# Patient Record
Sex: Male | Born: 1955 | Race: White | Hispanic: No | Marital: Married | Smoking: Former smoker
Health system: Southern US, Community
[De-identification: ages and names within clinical notes are randomized; demographics above are authoritative.]

## PROBLEM LIST (undated history)

## (undated) DIAGNOSIS — Z789 Other specified health status: Secondary | ICD-10-CM

---

## 2000-01-26 ENCOUNTER — Emergency Department (HOSPITAL_COMMUNITY): Admission: EM | Admit: 2000-01-26 | Discharge: 2000-01-26 | Payer: Self-pay | Admitting: Emergency Medicine

## 2021-08-18 ENCOUNTER — Encounter (HOSPITAL_BASED_OUTPATIENT_CLINIC_OR_DEPARTMENT_OTHER): Payer: Self-pay

## 2021-08-18 ENCOUNTER — Other Ambulatory Visit: Payer: Self-pay

## 2021-08-18 DIAGNOSIS — D72829 Elevated white blood cell count, unspecified: Secondary | ICD-10-CM | POA: Insufficient documentation

## 2021-08-18 DIAGNOSIS — R112 Nausea with vomiting, unspecified: Secondary | ICD-10-CM | POA: Insufficient documentation

## 2021-08-18 DIAGNOSIS — R9431 Abnormal electrocardiogram [ECG] [EKG]: Secondary | ICD-10-CM | POA: Diagnosis not present

## 2021-08-18 DIAGNOSIS — R1084 Generalized abdominal pain: Secondary | ICD-10-CM | POA: Diagnosis not present

## 2021-08-18 LAB — CBC
HCT: 47.3 % (ref 39.0–52.0)
Hemoglobin: 16.2 g/dL (ref 13.0–17.0)
MCH: 29.8 pg (ref 26.0–34.0)
MCHC: 34.2 g/dL (ref 30.0–36.0)
MCV: 87.1 fL (ref 80.0–100.0)
Platelets: 284 10*3/uL (ref 150–400)
RBC: 5.43 MIL/uL (ref 4.22–5.81)
RDW: 12.9 % (ref 11.5–15.5)
WBC: 13.8 10*3/uL — ABNORMAL HIGH (ref 4.0–10.5)
nRBC: 0 % (ref 0.0–0.2)

## 2021-08-18 NOTE — ED Triage Notes (Signed)
Pt arrives with c/o ABD pain that started a few hours ago. Pt endorses n/v.  ?

## 2021-08-18 NOTE — ED Notes (Signed)
Pt given urine cup for urine specimen. Pt stated he is unable to go at this time.  ?

## 2021-08-19 ENCOUNTER — Emergency Department (HOSPITAL_BASED_OUTPATIENT_CLINIC_OR_DEPARTMENT_OTHER)
Admission: EM | Admit: 2021-08-19 | Discharge: 2021-08-19 | Disposition: A | Discharge status: Home / Self Care | Payer: BC Managed Care – PPO | Attending: Emergency Medicine | Admitting: Emergency Medicine

## 2021-08-19 ENCOUNTER — Emergency Department (HOSPITAL_BASED_OUTPATIENT_CLINIC_OR_DEPARTMENT_OTHER): Payer: BC Managed Care – PPO

## 2021-08-19 DIAGNOSIS — K5730 Diverticulosis of large intestine without perforation or abscess without bleeding: Secondary | ICD-10-CM | POA: Diagnosis not present

## 2021-08-19 DIAGNOSIS — K8020 Calculus of gallbladder without cholecystitis without obstruction: Secondary | ICD-10-CM | POA: Diagnosis not present

## 2021-08-19 DIAGNOSIS — R1084 Generalized abdominal pain: Secondary | ICD-10-CM

## 2021-08-19 DIAGNOSIS — N281 Cyst of kidney, acquired: Secondary | ICD-10-CM | POA: Diagnosis not present

## 2021-08-19 LAB — URINALYSIS, ROUTINE W REFLEX MICROSCOPIC
Bilirubin Urine: NEGATIVE
Glucose, UA: NEGATIVE mg/dL
Hgb urine dipstick: NEGATIVE
Ketones, ur: 40 mg/dL — AB
Leukocytes,Ua: NEGATIVE
Nitrite: NEGATIVE
Specific Gravity, Urine: 1.021 (ref 1.005–1.030)
pH: 8 (ref 5.0–8.0)

## 2021-08-19 LAB — COMPREHENSIVE METABOLIC PANEL
ALT: 33 U/L (ref 0–44)
AST: 23 U/L (ref 15–41)
Albumin: 5.1 g/dL — ABNORMAL HIGH (ref 3.5–5.0)
Alkaline Phosphatase: 77 U/L (ref 38–126)
Anion gap: 17 — ABNORMAL HIGH (ref 5–15)
BUN: 17 mg/dL (ref 8–23)
CO2: 22 mmol/L (ref 22–32)
Calcium: 10.7 mg/dL — ABNORMAL HIGH (ref 8.9–10.3)
Chloride: 99 mmol/L (ref 98–111)
Creatinine, Ser: 1.19 mg/dL (ref 0.61–1.24)
GFR, Estimated: >60 mL/min (ref >60)
Glucose, Bld: 182 mg/dL — ABNORMAL HIGH (ref 70–99)
Potassium: 3.8 mmol/L (ref 3.5–5.1)
Sodium: 138 mmol/L (ref 135–145)
Total Bilirubin: 0.5 mg/dL (ref 0.3–1.2)
Total Protein: 7.9 g/dL (ref 6.5–8.1)

## 2021-08-19 LAB — LIPASE, BLOOD: Lipase: 20 U/L (ref 11–51)

## 2021-08-19 MED ORDER — SODIUM CHLORIDE 0.9 % IV BOLUS
1000.0000 mL | Freq: Once | INTRAVENOUS | Status: AC
  Administered 2021-08-19: 1000 mL via INTRAVENOUS

## 2021-08-19 MED ORDER — ONDANSETRON 4 MG PO TBDP: 4.0000 mg | ORAL_TABLET | Freq: Three times a day (TID) | ORAL | 0 refills | Status: DC | PRN

## 2021-08-19 MED ORDER — MORPHINE SULFATE (PF) 4 MG/ML IV SOLN
4.0000 mg | Freq: Once | INTRAVENOUS | Status: AC
Start: 2021-08-19 — End: 2021-08-19
  Administered 2021-08-19: 4 mg via INTRAVENOUS
  Filled 2021-08-19: qty 1

## 2021-08-19 MED ORDER — OXYCODONE-ACETAMINOPHEN 5-325 MG PO TABS: 1.0000 | ORAL_TABLET | Freq: Four times a day (QID) | ORAL | 0 refills | Status: DC | PRN

## 2021-08-19 MED ORDER — IOHEXOL 300 MG/ML  SOLN
100.0000 mL | Freq: Once | INTRAMUSCULAR | Status: AC | PRN
  Administered 2021-08-19: 100 mL via INTRAVENOUS

## 2021-08-19 MED ORDER — ONDANSETRON HCL 4 MG/2ML IJ SOLN
4.0000 mg | Freq: Once | INTRAMUSCULAR | Status: AC
  Administered 2021-08-19: 4 mg via INTRAVENOUS
  Filled 2021-08-19: qty 2

## 2021-08-19 NOTE — Discharge Instructions (Signed)
You were seen today for abdominal pain.  Your CT scan shows a dilated loop of bowel which could transient or an ileus.  Make sure that you are staying hydrated.  Take medications as prescribed.  Stick to easily digestible foods such as broths/soups for the next 24 hours. ?

## 2021-08-19 NOTE — ED Provider Notes (Signed)
?[facility] EMERGENCY DEPT ?Provider Note ? ? ?CSN: [id number] ?Arrival date & time: 08/18/21  2257 ? ?  ? ?History ? ?Chief Complaint  ?Patient presents with  ? Abdominal Pain  ? ? ?Jeffrey Green is a 66 y.o. male. ? ?HPI ? ?  ? ?This is a 66 year old male with no reported past medical history who presents with abdominal pain and vomiting.  Patient reports acute onset of abdominal pain earlier yesterday evening.  Reports associated nonbilious, nonbloody emesis.  Initially started across the mid abdomen and then radiated upwards.  Patient had a similar episode a week or 2 ago that self resolved.  He reports normal bowel movements.  No prior abdominal surgical history.  Denies alcohol or drug use.  States that the pain is sharp and can come and go.  Denies urinary symptoms or fevers. ? ?Home Medications ?Prior to Admission medications   ?Medication Sig Start Date End Date Taking? Authorizing Provider  ?ondansetron (ZOFRAN-ODT) 4 MG disintegrating tablet Take 1 tablet (4 mg total) by mouth every 8 (eight) hours as needed for nausea or vomiting. 08/19/21  Yes Aaleyah Witherow, Barbette Hair, MD  ?oxyCODONE-acetaminophen (PERCOCET/ROXICET) 5-325 MG tablet Take 1 tablet by mouth every 6 (six) hours as needed for severe pain. 08/19/21   Antrice Pal, Barbette Hair, MD  ?   ? ?Allergies    ?Patient has no known allergies.   ? ?Review of Systems   ?Review of Systems  ?Constitutional:  Negative for fever.  ?Respiratory:  Negative for shortness of breath.   ?Cardiovascular:  Negative for chest pain.  ?Gastrointestinal:  Positive for abdominal pain, nausea and vomiting. Negative for constipation and diarrhea.  ?All other systems reviewed and are negative. ? ?Physical Exam ?Updated Vital Signs ?BP (!) 165/87   Pulse 94   Temp 98.9 ?F (37.2 ?C) (Oral)   Resp 15   Ht 1.803 m (5\' 11" )   Wt 77.1 kg   SpO2 99%   BMI 23.71 kg/m?  ?Physical Exam ?Vitals and nursing note reviewed.  ?Constitutional:   ?   Appearance: He is well-developed.   ?HENT:  ?   Head: Normocephalic and atraumatic.  ?Eyes:  ?   Pupils: Pupils are equal, round, and reactive to light.  ?Cardiovascular:  ?   Rate and Rhythm: Normal rate and regular rhythm.  ?   Heart sounds: Normal heart sounds. No murmur heard. ?Pulmonary:  ?   Effort: Pulmonary effort is normal. No respiratory distress.  ?   Breath sounds: Normal breath sounds. No wheezing.  ?Abdominal:  ?   General: Bowel sounds are normal.  ?   Palpations: Abdomen is soft.  ?   Tenderness: There is generalized abdominal tenderness and tenderness in the epigastric area and periumbilical area. There is no guarding or rebound.  ?Musculoskeletal:  ?   Cervical back: Neck supple.  ?Lymphadenopathy:  ?   Cervical: No cervical adenopathy.  ?Skin: ?   General: Skin is warm and dry.  ?Neurological:  ?   Mental Status: He is alert and oriented to person, place, and time.  ?Psychiatric:     ?   Mood and Affect: Mood normal.  ? ? ?ED Results / Procedures / Treatments   ?Labs ?(all labs ordered are listed, but only abnormal results are displayed) ?Labs Reviewed  ?COMPREHENSIVE METABOLIC PANEL - Abnormal; Notable for the following components:  ?    Result Value  ? Glucose, Bld 182 (*)   ? Calcium 10.7 (*)   ? Albumin 5.1 (*)   ?  Anion gap 17 (*)   ? All other components within normal limits  ?CBC - Abnormal; Notable for the following components:  ? WBC 13.8 (*)   ? All other components within normal limits  ?URINALYSIS, ROUTINE W REFLEX MICROSCOPIC - Abnormal; Notable for the following components:  ? APPearance HAZY (*)   ? Ketones, ur 40 (*)   ? Protein, ur TRACE (*)   ? All other components within normal limits  ?LIPASE, BLOOD  ? ? ?EKG ?EKG Interpretation ? ?Date/Time:  Monday August 19 2021 00:25:09 EDT ?Ventricular Rate:  75 ?PR Interval:  124 ?QRS Duration: 93 ?QT Interval:  428 ?QTC Calculation: 479 ?R Axis:   57 ?Text Interpretation: Sinus rhythm Borderline repolarization abnormality Borderline prolonged QT interval Confirmed by  Thayer Jew [phone]) on 08/19/2021 5:09:54 AM ? ?Radiology ?CT ABDOMEN PELVIS W CONTRAST ? ?Result Date: 08/19/2021 ?CLINICAL DATA:  Abdominal pain with nausea and vomiting. EXAM: CT ABDOMEN AND PELVIS WITH CONTRAST TECHNIQUE: Multidetector CT imaging of the abdomen and pelvis was performed using the standard protocol following bolus administration of intravenous contrast. RADIATION DOSE REDUCTION: This exam was performed according to the departmental dose-optimization program which includes automated exposure control, adjustment of the mA and/or kV according to patient size and/or use of iterative reconstruction technique. CONTRAST:  14mL OMNIPAQUE IOHEXOL 300 MG/ML  SOLN COMPARISON:  None. FINDINGS: Lower chest: No acute abnormality. Hepatobiliary: No focal liver abnormality is seen. Numerous subcentimeter gallstones are seen within the gallbladder lumen, with a 1.4 cm gallstone noted within the gallbladder neck. There is no evidence of gallbladder wall thickening, pericholecystic inflammation or biliary dilatation. Pancreas: Unremarkable. No pancreatic ductal dilatation or surrounding inflammatory changes. Spleen: Normal in size without focal abnormality. Adrenals/Urinary Tract: Adrenal glands are unremarkable. Kidneys are normal in size, without renal calculi or hydronephrosis. A 2.3 cm x 2.2 cm x 2.4 cm simple cyst is seen within the mid to upper right kidney. No additional follow-up or imaging is recommended. Bladder is unremarkable. Stomach/Bowel: There is a small hiatal hernia. Appendix appears normal. A short segment of prominent small bowel is seen within the anterior aspect of the mid abdomen, along the midline (maximum small bowel diameter of approximately 2.9 cm). A transition zone is seen within the anterior aspect of the mid left abdomen (axial CT images 17 through 22, CT series 2). Noninflamed diverticula are seen within the sigmoid colon. Vascular/Lymphatic: No significant vascular findings are  present. No enlarged abdominal or pelvic lymph nodes. Reproductive: Prostate is unremarkable. Other: No abdominal wall hernia or abnormality. No abdominopelvic ascites. Musculoskeletal: Degenerative changes seen within the lumbar spine. IMPRESSION: 1. Short segment of prominent small bowel within the mid abdomen which may be transient in nature. Correlation with follow-up imaging is recommended, as an early small-bowel obstruction versus ileus cannot be excluded. 2. Cholelithiasis. 3. Small hiatal hernia. 4. Sigmoid diverticulosis. Electronically Signed   By: Virgina Norfolk M.D.   On: 08/19/2021 03:05   ? ?Procedures ?Procedures  ? ? ?Medications Ordered in ED ?Medications  ?morphine (PF) 4 MG/ML injection 4 mg (4 mg Intravenous Given 08/19/21 0140)  ?ondansetron (ZOFRAN) injection 4 mg (4 mg Intravenous Given 08/19/21 0140)  ?sodium chloride 0.9 % bolus 1,000 mL (0 mLs Intravenous Stopped 08/19/21 0306)  ?iohexol (OMNIPAQUE) 300 MG/ML solution 100 mL (100 mLs Intravenous Contrast Given 08/19/21 0151)  ? ? ?ED Course/ Medical Decision Making/ A&P ?  ?                        ?  Medical Decision Making ?Amount and/or Complexity of Data Reviewed ?Labs: ordered. ?Radiology: ordered. ? ?Risk ?Prescription drug management. ? ? ?This patient presents to the ED for concern of abdominal pain, nausea, vomiting, this involves an extensive number of treatment options, and is a complaint that carries with it a high risk of complications and morbidity.  The differential diagnosis includes gastroenteritis, obstructive pathology, cholecystitis, appendicitis, colitis ? ?MDM:   ? ?This is a 66 year old male who presents with abdominal pain, nausea, vomiting.  He is nontoxic and vital signs are notable for initially mild tachycardia.  Reports significant pain.  Has had 1 self-limited episode previously.  He has tenderness in the abdomen no signs peritonitis.  Labs reviewed.  LFTs and lipase normal.  This would argue against gallbladder  or pancreatic pathology.  He does have a slight leukocytosis to 13.  CT scan obtained.  CT scan shows 1 prominent loop of small bowel.  Could be related to ileus or early obstruction.  Could also be transient.  Additio

## 2021-08-19 NOTE — ED Notes (Signed)
Patient transported to CT 

## 2021-08-24 ENCOUNTER — Encounter (HOSPITAL_BASED_OUTPATIENT_CLINIC_OR_DEPARTMENT_OTHER): Payer: Self-pay | Admitting: Emergency Medicine

## 2021-08-24 ENCOUNTER — Inpatient Hospital Stay: Admit: 2021-08-24 | Payer: BC Managed Care – PPO | Admitting: Surgery

## 2021-08-24 ENCOUNTER — Emergency Department (HOSPITAL_COMMUNITY): Payer: BC Managed Care – PPO | Admitting: Anesthesiology

## 2021-08-24 ENCOUNTER — Emergency Department (HOSPITAL_BASED_OUTPATIENT_CLINIC_OR_DEPARTMENT_OTHER): Payer: BC Managed Care – PPO | Admitting: Radiology

## 2021-08-24 ENCOUNTER — Encounter (HOSPITAL_COMMUNITY)
Admission: EM | Disposition: A | Discharge status: Home / Self Care | Payer: Self-pay | Source: Home / Self Care | Attending: Emergency Medicine

## 2021-08-24 ENCOUNTER — Emergency Department (HOSPITAL_BASED_OUTPATIENT_CLINIC_OR_DEPARTMENT_OTHER): Payer: BC Managed Care – PPO

## 2021-08-24 ENCOUNTER — Ambulatory Visit (HOSPITAL_BASED_OUTPATIENT_CLINIC_OR_DEPARTMENT_OTHER)
Admission: EM | Admit: 2021-08-24 | Discharge: 2021-08-24 | Disposition: A | Discharge status: Home / Self Care | Payer: BC Managed Care – PPO | Attending: Emergency Medicine | Admitting: Emergency Medicine

## 2021-08-24 ENCOUNTER — Other Ambulatory Visit: Payer: Self-pay

## 2021-08-24 DIAGNOSIS — K810 Acute cholecystitis: Secondary | ICD-10-CM | POA: Diagnosis not present

## 2021-08-24 DIAGNOSIS — I700 Atherosclerosis of aorta: Secondary | ICD-10-CM | POA: Insufficient documentation

## 2021-08-24 DIAGNOSIS — F17210 Nicotine dependence, cigarettes, uncomplicated: Secondary | ICD-10-CM | POA: Diagnosis not present

## 2021-08-24 DIAGNOSIS — R109 Unspecified abdominal pain: Secondary | ICD-10-CM | POA: Diagnosis not present

## 2021-08-24 DIAGNOSIS — K8012 Calculus of gallbladder with acute and chronic cholecystitis without obstruction: Secondary | ICD-10-CM | POA: Insufficient documentation

## 2021-08-24 DIAGNOSIS — K8066 Calculus of gallbladder and bile duct with acute and chronic cholecystitis without obstruction: Secondary | ICD-10-CM | POA: Diagnosis not present

## 2021-08-24 DIAGNOSIS — K8050 Calculus of bile duct without cholangitis or cholecystitis without obstruction: Secondary | ICD-10-CM | POA: Diagnosis not present

## 2021-08-24 DIAGNOSIS — N281 Cyst of kidney, acquired: Secondary | ICD-10-CM | POA: Diagnosis not present

## 2021-08-24 DIAGNOSIS — K8000 Calculus of gallbladder with acute cholecystitis without obstruction: Secondary | ICD-10-CM | POA: Diagnosis not present

## 2021-08-24 HISTORY — PX: CHOLECYSTECTOMY: SHX55

## 2021-08-24 HISTORY — DX: Other specified health status: Z78.9

## 2021-08-24 LAB — COMPREHENSIVE METABOLIC PANEL
ALT: 20 U/L (ref 0–44)
AST: 16 U/L (ref 15–41)
Albumin: 4.7 g/dL (ref 3.5–5.0)
Alkaline Phosphatase: 65 U/L (ref 38–126)
Anion gap: 13 (ref 5–15)
BUN: 10 mg/dL (ref 8–23)
CO2: 26 mmol/L (ref 22–32)
Calcium: 9.3 mg/dL (ref 8.9–10.3)
Chloride: 99 mmol/L (ref 98–111)
Creatinine, Ser: 0.96 mg/dL (ref 0.61–1.24)
GFR, Estimated: >60 mL/min (ref >60)
Glucose, Bld: 157 mg/dL — ABNORMAL HIGH (ref 70–99)
Potassium: 3.3 mmol/L — ABNORMAL LOW (ref 3.5–5.1)
Sodium: 138 mmol/L (ref 135–145)
Total Bilirubin: 0.6 mg/dL (ref 0.3–1.2)
Total Protein: 7.7 g/dL (ref 6.5–8.1)

## 2021-08-24 LAB — CBC WITH DIFFERENTIAL/PLATELET
Abs Immature Granulocytes: 0.02 10*3/uL (ref 0.00–0.07)
Basophils Absolute: 0 10*3/uL (ref 0.0–0.1)
Basophils Relative: 0 %
Eosinophils Absolute: 0 10*3/uL (ref 0.0–0.5)
Eosinophils Relative: 0 %
HCT: 44.7 % (ref 39.0–52.0)
Hemoglobin: 15.5 g/dL (ref 13.0–17.0)
Immature Granulocytes: 0 %
Lymphocytes Relative: 9 %
Lymphs Abs: 1 10*3/uL (ref 0.7–4.0)
MCH: 30.4 pg (ref 26.0–34.0)
MCHC: 34.7 g/dL (ref 30.0–36.0)
MCV: 87.6 fL (ref 80.0–100.0)
Monocytes Absolute: 0.4 10*3/uL (ref 0.1–1.0)
Monocytes Relative: 3 %
Neutro Abs: 9.7 10*3/uL — ABNORMAL HIGH (ref 1.7–7.7)
Neutrophils Relative %: 88 %
Platelets: 255 10*3/uL (ref 150–400)
RBC: 5.1 MIL/uL (ref 4.22–5.81)
RDW: 12.7 % (ref 11.5–15.5)
WBC: 11.2 10*3/uL — ABNORMAL HIGH (ref 4.0–10.5)
nRBC: 0 % (ref 0.0–0.2)

## 2021-08-24 LAB — LIPASE, BLOOD: Lipase: <10 U/L — ABNORMAL LOW (ref 11–51)

## 2021-08-24 LAB — LACTIC ACID, PLASMA: Lactic Acid, Venous: 2 mmol/L (ref 0.5–1.9)

## 2021-08-24 SURGERY — LAPAROSCOPIC CHOLECYSTECTOMY
Anesthesia: General

## 2021-08-24 MED ORDER — ONDANSETRON HCL 4 MG/2ML IJ SOLN
INTRAMUSCULAR | Status: AC
  Filled 2021-08-24: qty 2

## 2021-08-24 MED ORDER — PROPOFOL 10 MG/ML IV BOLUS
INTRAVENOUS | Status: DC | PRN
  Administered 2021-08-24: 170 mg via INTRAVENOUS

## 2021-08-24 MED ORDER — LACTATED RINGERS IV BOLUS
1000.0000 mL | Freq: Once | INTRAVENOUS | Status: AC
Start: 2021-08-24 — End: 2021-08-24
  Administered 2021-08-24: 1000 mL via INTRAVENOUS

## 2021-08-24 MED ORDER — DEXAMETHASONE SODIUM PHOSPHATE 10 MG/ML IJ SOLN
INTRAMUSCULAR | Status: AC
  Filled 2021-08-24: qty 1

## 2021-08-24 MED ORDER — ONDANSETRON HCL 4 MG/2ML IJ SOLN
4.0000 mg | Freq: Once | INTRAMUSCULAR | Status: AC
  Administered 2021-08-24: 4 mg via INTRAVENOUS
  Filled 2021-08-24: qty 2

## 2021-08-24 MED ORDER — ONDANSETRON HCL 4 MG/2ML IJ SOLN
INTRAMUSCULAR | Status: DC | PRN
  Administered 2021-08-24: 4 mg via INTRAVENOUS

## 2021-08-24 MED ORDER — FENTANYL CITRATE (PF) 250 MCG/5ML IJ SOLN
INTRAMUSCULAR | Status: DC | PRN
  Administered 2021-08-24: 25 ug via INTRAVENOUS
  Administered 2021-08-24 (×3): 50 ug via INTRAVENOUS
  Administered 2021-08-24: 25 ug via INTRAVENOUS
  Administered 2021-08-24: 50 ug via INTRAVENOUS

## 2021-08-24 MED ORDER — ONDANSETRON HCL 4 MG/2ML IJ SOLN: 4.0000 mg | Freq: Once | INTRAMUSCULAR | Status: DC | PRN

## 2021-08-24 MED ORDER — SODIUM CHLORIDE 0.9 % IV SOLN
1.0000 g | Freq: Once | INTRAVENOUS | Status: AC
  Administered 2021-08-24: 1 g via INTRAVENOUS
  Filled 2021-08-24: qty 10

## 2021-08-24 MED ORDER — BUPIVACAINE-EPINEPHRINE 0.25% -1:200000 IJ SOLN
INTRAMUSCULAR | Status: DC | PRN
  Administered 2021-08-24: 20 mL

## 2021-08-24 MED ORDER — CHLORHEXIDINE GLUCONATE 0.12 % MT SOLN
OROMUCOSAL | Status: AC
  Administered 2021-08-24: 15 mL via OROMUCOSAL
  Filled 2021-08-24: qty 15

## 2021-08-24 MED ORDER — LACTATED RINGERS IV SOLN: INTRAVENOUS | Status: DC

## 2021-08-24 MED ORDER — OXYCODONE HCL 5 MG PO TABS: 5.0000 mg | ORAL_TABLET | Freq: Once | ORAL | Status: DC | PRN

## 2021-08-24 MED ORDER — CHLORHEXIDINE GLUCONATE 0.12 % MT SOLN: 15.0000 mL | Freq: Once | OROMUCOSAL | Status: AC

## 2021-08-24 MED ORDER — PHENYLEPHRINE HCL-NACL 20-0.9 MG/250ML-% IV SOLN
INTRAVENOUS | Status: DC | PRN
  Administered 2021-08-24: 50 ug/min via INTRAVENOUS

## 2021-08-24 MED ORDER — CELECOXIB 200 MG PO CAPS: 200.0000 mg | ORAL_CAPSULE | Freq: Once | ORAL | Status: AC

## 2021-08-24 MED ORDER — BUPIVACAINE-EPINEPHRINE (PF) 0.25% -1:200000 IJ SOLN
INTRAMUSCULAR | Status: AC
  Filled 2021-08-24: qty 30

## 2021-08-24 MED ORDER — PHENYLEPHRINE 40 MCG/ML (10ML) SYRINGE FOR IV PUSH (FOR BLOOD PRESSURE SUPPORT)
PREFILLED_SYRINGE | INTRAVENOUS | Status: AC
  Filled 2021-08-24: qty 10

## 2021-08-24 MED ORDER — FENTANYL CITRATE (PF) 250 MCG/5ML IJ SOLN
INTRAMUSCULAR | Status: AC
  Filled 2021-08-24: qty 5

## 2021-08-24 MED ORDER — SODIUM CHLORIDE 0.9 % IR SOLN
Status: DC | PRN
  Administered 2021-08-24: 1000 mL

## 2021-08-24 MED ORDER — DEXAMETHASONE SODIUM PHOSPHATE 10 MG/ML IJ SOLN
INTRAMUSCULAR | Status: DC | PRN
  Administered 2021-08-24: 10 mg via INTRAVENOUS

## 2021-08-24 MED ORDER — ACETAMINOPHEN 500 MG PO TABS
ORAL_TABLET | ORAL | Status: AC
  Administered 2021-08-24: 1000 mg via ORAL
  Filled 2021-08-24: qty 2

## 2021-08-24 MED ORDER — LIDOCAINE 2% (20 MG/ML) 5 ML SYRINGE
INTRAMUSCULAR | Status: DC | PRN
  Administered 2021-08-24: 60 mg via INTRAVENOUS

## 2021-08-24 MED ORDER — HYDROMORPHONE HCL 1 MG/ML IJ SOLN
1.0000 mg | Freq: Once | INTRAMUSCULAR | Status: AC
  Administered 2021-08-24: 1 mg via INTRAVENOUS
  Filled 2021-08-24: qty 1

## 2021-08-24 MED ORDER — MIDAZOLAM HCL 2 MG/2ML IJ SOLN
INTRAMUSCULAR | Status: DC | PRN
  Administered 2021-08-24: 2 mg via INTRAVENOUS

## 2021-08-24 MED ORDER — ORAL CARE MOUTH RINSE: 15.0000 mL | Freq: Once | OROMUCOSAL | Status: AC

## 2021-08-24 MED ORDER — MIDAZOLAM HCL 2 MG/2ML IJ SOLN
INTRAMUSCULAR | Status: AC
  Filled 2021-08-24: qty 2

## 2021-08-24 MED ORDER — CELECOXIB 200 MG PO CAPS
ORAL_CAPSULE | ORAL | Status: AC
Start: 2021-08-24 — End: 2021-08-24
  Administered 2021-08-24: 200 mg via ORAL
  Filled 2021-08-24: qty 1

## 2021-08-24 MED ORDER — ROCURONIUM BROMIDE 10 MG/ML (PF) SYRINGE
PREFILLED_SYRINGE | INTRAVENOUS | Status: DC | PRN
  Administered 2021-08-24: 60 mg via INTRAVENOUS

## 2021-08-24 MED ORDER — IOHEXOL 300 MG/ML  SOLN
100.0000 mL | Freq: Once | INTRAMUSCULAR | Status: AC | PRN
  Administered 2021-08-24: 100 mL via INTRAVENOUS

## 2021-08-24 MED ORDER — OXYCODONE HCL 5 MG PO TABS: 5.0000 mg | ORAL_TABLET | Freq: Four times a day (QID) | ORAL | 0 refills | Status: DC | PRN

## 2021-08-24 MED ORDER — PHENYLEPHRINE 40 MCG/ML (10ML) SYRINGE FOR IV PUSH (FOR BLOOD PRESSURE SUPPORT)
PREFILLED_SYRINGE | INTRAVENOUS | Status: DC | PRN
  Administered 2021-08-24: 80 ug via INTRAVENOUS

## 2021-08-24 MED ORDER — OXYCODONE HCL 5 MG/5ML PO SOLN: 5.0000 mg | Freq: Once | ORAL | Status: DC | PRN

## 2021-08-24 MED ORDER — METRONIDAZOLE 500 MG/100ML IV SOLN
500.0000 mg | Freq: Two times a day (BID) | INTRAVENOUS | Status: DC
  Administered 2021-08-24: 500 mg via INTRAVENOUS
  Filled 2021-08-24: qty 100

## 2021-08-24 MED ORDER — 0.9 % SODIUM CHLORIDE (POUR BTL) OPTIME
TOPICAL | Status: DC | PRN
  Administered 2021-08-24: 1000 mL

## 2021-08-24 MED ORDER — ACETAMINOPHEN 500 MG PO TABS: 1000.0000 mg | ORAL_TABLET | Freq: Once | ORAL | Status: AC

## 2021-08-24 MED ORDER — HYDROMORPHONE HCL 1 MG/ML IJ SOLN
1.0000 mg | Freq: Once | INTRAMUSCULAR | Status: AC
  Administered 2021-08-24: 1 mg via INTRAVENOUS

## 2021-08-24 MED ORDER — PROPOFOL 10 MG/ML IV BOLUS
INTRAVENOUS | Status: AC
  Filled 2021-08-24: qty 20

## 2021-08-24 MED ORDER — FENTANYL CITRATE (PF) 100 MCG/2ML IJ SOLN: 25.0000 ug | INTRAMUSCULAR | Status: DC | PRN

## 2021-08-24 MED ORDER — DEXMEDETOMIDINE (PRECEDEX) IN NS 20 MCG/5ML (4 MCG/ML) IV SYRINGE
PREFILLED_SYRINGE | INTRAVENOUS | Status: DC | PRN
  Administered 2021-08-24: 8 ug via INTRAVENOUS

## 2021-08-24 MED ORDER — SUGAMMADEX SODIUM 200 MG/2ML IV SOLN
INTRAVENOUS | Status: DC | PRN
Start: 2021-08-24 — End: 2021-08-24
  Administered 2021-08-24: 200 mg via INTRAVENOUS

## 2021-08-24 SURGICAL SUPPLY — 47 items
ADH SKN CLS APL DERMABOND .7 (GAUZE/BANDAGES/DRESSINGS) ×1
APL PRP STRL LF DISP 70% ISPRP (MISCELLANEOUS) ×1
APPLIER CLIP 5 13 M/L LIGAMAX5 (MISCELLANEOUS) ×2
APR CLP MED LRG 5 ANG JAW (MISCELLANEOUS) ×1
BAG COUNTER SPONGE SURGICOUNT (BAG) ×3 IMPLANT
BAG SPEC RTRVL LRG 6X4 10 (ENDOMECHANICALS) ×1
BAG SPNG CNTER NS LX DISP (BAG) ×1
BLADE CLIPPER SURG (BLADE) IMPLANT
CANISTER SUCT 3000ML PPV (MISCELLANEOUS) ×3 IMPLANT
CHLORAPREP W/TINT 26 (MISCELLANEOUS) ×3 IMPLANT
CLIP APPLIE 5 13 M/L LIGAMAX5 (MISCELLANEOUS) ×2 IMPLANT
COVER SURGICAL LIGHT HANDLE (MISCELLANEOUS) ×3 IMPLANT
DERMABOND ADVANCED (GAUZE/BANDAGES/DRESSINGS) ×1
DERMABOND ADVANCED .7 DNX12 (GAUZE/BANDAGES/DRESSINGS) ×2 IMPLANT
ELECT REM PT RETURN 9FT ADLT (ELECTROSURGICAL) ×2
ELECTRODE REM PT RTRN 9FT ADLT (ELECTROSURGICAL) ×2 IMPLANT
GLOVE BIOGEL PI IND STRL 6 (GLOVE) ×2 IMPLANT
GLOVE BIOGEL PI INDICATOR 6 (GLOVE) ×1
GLOVE BIOGEL PI MICRO 5.5 (GLOVE) ×1
GLOVE BIOGEL PI MICRO STRL 5.5 (GLOVE) ×2 IMPLANT
GOWN STRL REUS W/ TWL LRG LVL3 (GOWN DISPOSABLE) ×6 IMPLANT
GOWN STRL REUS W/TWL LRG LVL3 (GOWN DISPOSABLE) ×6
KIT BASIN OR (CUSTOM PROCEDURE TRAY) ×3 IMPLANT
KIT TURNOVER KIT B (KITS) ×3 IMPLANT
L-HOOK LAP DISP 36CM (ELECTROSURGICAL) ×2
LHOOK LAP DISP 36CM (ELECTROSURGICAL) ×2 IMPLANT
NDL INSUFFLATION 14GA 120MM (NEEDLE) IMPLANT
NEEDLE INSUFFLATION 14GA 120MM (NEEDLE) IMPLANT
NS IRRIG 1000ML POUR BTL (IV SOLUTION) ×3 IMPLANT
PAD ARMBOARD 7.5X6 YLW CONV (MISCELLANEOUS) ×3 IMPLANT
PENCIL BUTTON HOLSTER BLD 10FT (ELECTRODE) ×3 IMPLANT
POUCH SPECIMEN RETRIEVAL 10MM (ENDOMECHANICALS) ×3 IMPLANT
SCISSORS LAP 5X35 DISP (ENDOMECHANICALS) ×3 IMPLANT
SET IRRIG TUBING LAPAROSCOPIC (IRRIGATION / IRRIGATOR) ×3 IMPLANT
SET TUBE SMOKE EVAC HIGH FLOW (TUBING) ×3 IMPLANT
SLEEVE ENDOPATH XCEL 5M (ENDOMECHANICALS) ×6 IMPLANT
SUT MNCRL AB 4-0 PS2 18 (SUTURE) ×3 IMPLANT
SUT VIC AB 3-0 SH 27 (SUTURE)
SUT VIC AB 3-0 SH 27XBRD (SUTURE) IMPLANT
TOWEL GREEN STERILE (TOWEL DISPOSABLE) ×3 IMPLANT
TOWEL GREEN STERILE FF (TOWEL DISPOSABLE) ×3 IMPLANT
TRAY LAPAROSCOPIC MC (CUSTOM PROCEDURE TRAY) ×3 IMPLANT
TROCAR XCEL 12X100 BLDLESS (ENDOMECHANICALS) ×1 IMPLANT
TROCAR XCEL BLUNT TIP 100MML (ENDOMECHANICALS) ×3 IMPLANT
TROCAR XCEL NON-BLD 5MMX100MML (ENDOMECHANICALS) ×3 IMPLANT
WARMER LAPAROSCOPE (MISCELLANEOUS) ×3 IMPLANT
WATER STERILE IRR 1000ML POUR (IV SOLUTION) ×3 IMPLANT

## 2021-08-24 NOTE — ED Notes (Signed)
Pt now returned from CT dept via stretcher escorted by CT tech   ?

## 2021-08-24 NOTE — ED Provider Notes (Signed)
Care of patient assumed from Dr. Bernette Mayers at 7 AM.  This patient has abdominal pain and CT scan findings concerning for acute cholecystitis.  Plan will be for transfer for surgical management.  Currently awaiting word from general surgery to advise on which hospital to transfer to. ?Physical Exam  ?BP (!) 202/99   Pulse 71   Temp 97.6 ?F (36.4 ?C) (Oral)   Resp 12   Ht 5\' 10"  (1.778 m)   Wt 77.1 kg   SpO2 100%   BMI 24.39 kg/m?  ? ?Physical Exam ?Vitals and nursing note reviewed.  ?Constitutional:   ?   General: He is not in acute distress. ?   Appearance: He is well-developed. He is not ill-appearing, toxic-appearing or diaphoretic.  ?HENT:  ?   Head: Normocephalic and atraumatic.  ?Eyes:  ?   General: No scleral icterus. ?   Conjunctiva/sclera: Conjunctivae normal.  ?   Pupils: Pupils are equal, round, and reactive to light.  ?Cardiovascular:  ?   Rate and Rhythm: Normal rate and regular rhythm.  ?   Heart sounds: No murmur heard. ?Pulmonary:  ?   Effort: Pulmonary effort is normal. No respiratory distress.  ?   Breath sounds: Normal breath sounds.  ?Musculoskeletal:     ?   General: No swelling.  ?   Cervical back: Neck supple.  ?Skin: ?   General: Skin is warm and dry.  ?   Capillary Refill: Capillary refill takes less than 2 seconds.  ?Neurological:  ?   General: No focal deficit present.  ?   Mental Status: He is alert and oriented to person, place, and time.  ?Psychiatric:     ?   Mood and Affect: Mood normal.     ?   Behavior: Behavior normal.  ? ? ?Procedures  ?Procedures ? ?ED Course / MDM  ? ?Clinical Course as of 08/24/21 0928  ?Sat Aug 24, 2021  ?0508 CBC shows improved leukocytosis from previous visit.  [CS]  ?0537 CMP and lipase are normal. No signs of biliary obstruction. Given degree of pain/tenderness and no clear source from labs, will send back for repeat CT. BP has been more elevated while in the ED, will continue to monitor.  [CS]  ?0509 Lactic acid is only mildly elevated. CT is pending.  Pain is improved. Continue to monitor. [CS]  ?1856 I personally viewed the images from radiology studies and agree with radiologist interpretation: CT without ileus/SBO but there is now signs of early cholecystitis with a large stone lodged in GB neck. Discussed with Dr. [id number], on call for General surgery who will plan for the patient to be admitted for cholecystectomy. He will talk to the day team to determine best location for the patient to go ([facility] vs [facility]) and let [facility] know.  ? [CS]  ?[place] I discussed the tentative plan with the patient who states his wife is out of town and he isn't sure he will be able to stay to get surgery today. I discussed with him the risks of leaving, including worsening pain, infection, gall bladder rupture, etc and he is going to discuss with his wife and make a decision about admission.  [CS]  ?7026 Patient would like to proceed with surgical planning. Care of the patient signed out to Dr. [id number] pending disposition destination decision from surgical team.  [CS]  ?  ?Clinical Course User Index ?[CS] Durwin Nora, MD  ? ?Medical Decision Making ?Amount and/or Complexity of Data Reviewed ?  Labs: ordered. ?Radiology: ordered. ? ?Risk ?Prescription drug management. ? ? ?I received notification from the general surgery team that patient is to be transferred to [facility] for surgical management.  Patient is to check in and go directly to preop.  Patient declined transfer via [facility].  He had a family member here to transport him.  Patient was instructed to check in at the ED to let them know that he was here for surgery.  This was done over speaker phone with surgical team.  Patient reported recurrence of pain and additional dose of Dilaudid was given.  Following antibiotics, patient was transferred in stable condition. ? ? ? ? ?  ?Gloris Manchester, MD ?08/24/21 0930 ? ?

## 2021-08-24 NOTE — ED Provider Notes (Signed)
? ?[facility] EMERGENCY DEPT  ?Provider Note ? ?CSN: [id number] ?Arrival date & time: 08/24/21 0413 ? ?History ?Chief Complaint  ?Patient presents with  ? Abdominal Pain  ? ? ?Ismeal Broy is a 66 y.o. male with no significant PMH reports onset of severe diffuse abdominal pain with vomiting starting about midnight. He was in the ED for similar but less severe pain 5 days ago. CT then showed small segment of possible ileus or early SBO and cholelithiasis. Labs done then were unremarkable, pain was controlled and he was eventually discharged. He was asymptomatic until pain started back tonight. He has not had any pain with eating. He did have a short similar episode a few weeks before his recent ED visit but otherwise has no known stomach issues, no prior surgeries. Does not drink or smoke. He took one pain pill earlier tonight without improvement.  ? ? ?Home Medications ?Prior to Admission medications   ?Medication Sig Start Date End Date Taking? Authorizing Provider  ?ondansetron (ZOFRAN-ODT) 4 MG disintegrating tablet Take 1 tablet (4 mg total) by mouth every 8 (eight) hours as needed for nausea or vomiting. 08/19/21   Horton, Mayer Masker, MD  ?oxyCODONE-acetaminophen (PERCOCET/ROXICET) 5-325 MG tablet Take 1 tablet by mouth every 6 (six) hours as needed for severe pain. 08/19/21   Horton, Mayer Masker, MD  ? ? ? ?Allergies    ?Patient has no known allergies. ? ? ?Review of Systems   ?Review of Systems ?Please see HPI for pertinent positives and negatives ? ?Physical Exam ?BP (!) 202/99   Pulse 71   Temp 97.6 ?F (36.4 ?C) (Oral)   Resp 12   Ht 5\' 10"  (1.778 m)   Wt 77.1 kg   SpO2 100%   BMI 24.39 kg/m?  ? ?Physical Exam ?Vitals and nursing note reviewed.  ?Constitutional:   ?   Appearance: Normal appearance.  ?   Comments: Uncomfortable appearing  ?HENT:  ?   Head: Normocephalic and atraumatic.  ?   Nose: Nose normal.  ?   Mouth/Throat:  ?   Mouth: Mucous membranes are moist.  ?Eyes:  ?   Extraocular  Movements: Extraocular movements intact.  ?   Conjunctiva/sclera: Conjunctivae normal.  ?Cardiovascular:  ?   Rate and Rhythm: Normal rate.  ?Pulmonary:  ?   Effort: Pulmonary effort is normal.  ?   Breath sounds: Normal breath sounds.  ?Abdominal:  ?   General: Abdomen is flat.  ?   Palpations: Abdomen is soft.  ?   Tenderness: There is generalized abdominal tenderness. There is guarding.  ?Musculoskeletal:     ?   General: No swelling. Normal range of motion.  ?   Cervical back: Neck supple.  ?Skin: ?   General: Skin is warm and dry.  ?Neurological:  ?   General: No focal deficit present.  ?   Mental Status: He is alert.  ?Psychiatric:     ?   Mood and Affect: Mood normal.  ? ? ?ED Results / Procedures / Treatments   ?EKG ?None ? ?Procedures ?Procedures ? ?Medications Ordered in the ED ?Medications  ?cefTRIAXone (ROCEPHIN) 1 g in sodium chloride 0.9 % 100 mL IVPB (1 g Intravenous New Bag/Given 08/24/21 0702)  ?lactated ringers infusion (has no administration in time range)  ?metroNIDAZOLE (FLAGYL) IVPB 500 mg (has no administration in time range)  ?HYDROmorphone (DILAUDID) injection 1 mg (1 mg Intravenous Given 08/24/21 0442)  ?ondansetron [facility]) injection 4 mg (4 mg Intravenous Given 08/24/21 0442)  ?  lactated ringers bolus 1,000 mL (0 mLs Intravenous Stopped 08/24/21 0622)  ?iohexol (OMNIPAQUE) 300 MG/ML solution 100 mL (100 mLs Intravenous Contrast Given 08/24/21 0555)  ? ? ?Initial Impression and Plan ? Patient here with recurrence of abdominal pain, diffuse guarding. Will recheck labs to evaluate signs of biliary obstruction. Also concern for perforation given his diffuse tenderness. Add AAS as an initial imaging study. Pain/Nausea meds and IVF for comfort.  ? ?ED Course  ? ?Clinical Course as of 08/24/21 0706  ?Sat Aug 24, 2021  ?0508 CBC shows improved leukocytosis from previous visit.  [CS]  ?0537 CMP and lipase are normal. No signs of biliary obstruction. Given degree of pain/tenderness and no clear source  from labs, will send back for repeat CT. BP has been more elevated while in the ED, will continue to monitor.  [CS]  ?6389 Lactic acid is only mildly elevated. CT is pending. Pain is improved. Continue to monitor. [CS]  ?3734 I personally viewed the images from radiology studies and agree with radiologist interpretation: CT without ileus/SBO but there is now signs of early cholecystitis with a large stone lodged in GB neck. Discussed with Dr. Janee Morn, on call for General surgery who will plan for the patient to be admitted for cholecystectomy. He will talk to the day team to determine best location for the patient to go ([facility] vs [facility]) and let us know.  ? [CS]  ?2876 I discussed the tentative plan with the patient who states his wife is out of town and he isn't sure he will be able to stay to get surgery today. I discussed with him the risks of leaving, including worsening pain, infection, gall bladder rupture, etc and he is going to discuss with his wife and make a decision about admission.  [CS]  ?8115 Patient would like to proceed with surgical planning. Care of the patient signed out to Dr. Durwin Nora pending disposition destination decision from surgical team.  [CS]  ?  ?Clinical Course User Index ?[CS] Pollyann Savoy, MD  ? ? ? ?MDM Rules/Calculators/A&P ?Medical Decision Making ?Problems Addressed: ?Biliary colic: acute illness or injury ? ?Amount and/or Complexity of Data Reviewed ?Labs: ordered. Decision-making details documented in ED Course. ?Radiology: ordered and independent interpretation performed. Decision-making details documented in ED Course. ? ?Risk ?Prescription drug management. ?Parenteral controlled substances. ?Decision regarding hospitalization. ? ? ? ?Final Clinical Impression(s) / ED Diagnoses ?Final diagnoses:  ?Biliary colic  ? ? ?Rx / DC Orders ?ED Discharge Orders   ? ? None  ? ?  ? ?  ?Pollyann Savoy, MD ?08/24/21 [phone] ? ?

## 2021-08-24 NOTE — ED Notes (Signed)
Late entry -- Pt reports eating a meal including baked chicken approx 4 hours prior to recurrent symptom onset and a peanut butter cracker after meal.  ?

## 2021-08-24 NOTE — Anesthesia Postprocedure Evaluation (Signed)
Anesthesia Post Note ? ?Patient: Juergen Kanner ? ?Procedure(s) Performed: LAPAROSCOPIC CHOLECYSTECTOMY ? ?  ? ?Patient location during evaluation: PACU ?Anesthesia Type: General ?Level of consciousness: awake and alert ?Pain management: pain level controlled ?Vital Signs Assessment: post-procedure vital signs reviewed and stable ?Respiratory status: spontaneous breathing, nonlabored ventilation and respiratory function stable ?Cardiovascular status: stable and blood pressure returned to baseline ?Anesthetic complications: no ? ? ?No notable events documented. ? ?Last Vitals:  ?Vitals:  ? 08/24/21 1433 08/24/21 1448  ?BP: (!) 142/79 (!) 147/78  ?Pulse: 64 80  ?Resp: 12 11  ?Temp:  36.4 ?C  ?SpO2: 94% 96%  ?  ?Last Pain:  ?Vitals:  ? 08/24/21 1448  ?TempSrc:   ?PainSc: 0-No pain  ? ? ?  ?  ?  ?  ?  ?  ? ?Beryle Lathe ? ? ? ? ?

## 2021-08-24 NOTE — H&P (Signed)
? ?Jeffrey Green ?Jul 04, 1955  ?[id number].   ? ?Chief Complaint/Reason for Consult: abdominal pain ? ?HPI:  ?[name] is a 66 yo male who presented to the ED last night with abdominal pain.  The pain began in the upper abdomen about 6 days ago and initially resolved.  It then came back yesterday after eating and was associated with nausea and vomiting he presented to the ED.  WBC was mildly elevated 11, and LFTs were normal.  A CT scan showed stones within the gallbladder with mild stranding, concerning for acute cholecystitis.  He was transferred to [facility] for surgery.  His pain is improved and he has not had any vomiting today. ? ?He is otherwise in good health and has not had any prior abdominal surgeries. ? ?ROS: ?Review of Systems  ?Constitutional:  Negative for fever.  ?Cardiovascular:  Negative for chest pain.  ?Gastrointestinal:  Positive for abdominal pain, nausea and vomiting.  ?Musculoskeletal:  Negative for back pain.  ?Neurological:  Negative for seizures and weakness.  ? ?History reviewed. No pertinent family history. ? ?Past Medical History:  ?Diagnosis Date  ? Medical history non-contributory   ? ? ?History reviewed. No pertinent surgical history. ? ?Social History:  reports that he has been smoking cigarettes. He has been smoking an average of .5 packs per day. He does not have any smokeless tobacco history on file. He reports current alcohol use. He reports that he does not currently use drugs. ? ?Allergies: No Known Allergies ? ?Medications Prior to Admission  ?Medication Sig Dispense Refill  ? ondansetron (ZOFRAN-ODT) 4 MG disintegrating tablet Take 1 tablet (4 mg total) by mouth every 8 (eight) hours as needed for nausea or vomiting. 20 tablet 0  ? oxyCODONE-acetaminophen (PERCOCET/ROXICET) 5-325 MG tablet Take 1 tablet by mouth every 6 (six) hours as needed for severe pain. 10 tablet 0  ? ? ? ?Physical Exam: ?Blood pressure (!) 148/87, pulse (!) 119, temperature 99.3 ?F (37.4 ?C), temperature  source Oral, resp. rate 17, height 5\' 10"  (1.778 m), weight 77.1 kg, SpO2 98 %. ?General: resting comfortably, appears stated age, no apparent distress ?Neurological: alert and oriented, no focal deficits, cranial nerves grossly in tact ?HEENT: normocephalic, atraumatic, oropharynx clear, no scleral icterus ?CV: regular rate and rhythm, extremities warm and well-perfused ?Respiratory: normal work of breathing on room air, symmetric chest wall expansion ?Abdomen: soft, nondistended, nontender to deep palpation. No masses or organomegaly. No surgical scars. ?Extremities: warm and well-perfused, no deformities, moving all extremities spontaneously ?Psychiatric: normal mood and affect ?Skin: warm and dry, no jaundice, no rashes or lesions ? ? ?Results for orders placed or performed during the hospital encounter of 08/24/21 (from the past 48 hour(s))  ?Comprehensive metabolic panel     Status: Abnormal  ? Collection Time: 08/24/21  4:23 AM  ?Result Value Ref Range  ? Sodium 138 135 - 145 mmol/L  ? Potassium 3.3 (L) 3.5 - 5.1 mmol/L  ? Chloride 99 98 - 111 mmol/L  ? CO2 26 22 - 32 mmol/L  ? Glucose, Bld 157 (H) 70 - 99 mg/dL  ?  Comment: Glucose reference range applies only to samples taken after fasting for at least 8 hours.  ? BUN 10 8 - 23 mg/dL  ? Creatinine, Ser 0.96 0.61 - 1.24 mg/dL  ? Calcium 9.3 8.9 - 10.3 mg/dL  ? Total Protein 7.7 6.5 - 8.1 g/dL  ? Albumin 4.7 3.5 - 5.0 g/dL  ? AST 16 15 - 41 U/L  ?  ALT 20 0 - 44 U/L  ? Alkaline Phosphatase 65 38 - 126 U/L  ? Total Bilirubin 0.6 0.3 - 1.2 mg/dL  ? GFR, Estimated >60 >60 mL/min  ?  Comment: (NOTE) ?Calculated using the CKD-EPI Creatinine Equation (2021) ?  ? Anion gap 13 5 - 15  ?  Comment: Performed at [facility], [address], [place], [place] [address]  ?Lipase, blood     Status: Abnormal  ? Collection Time: 08/24/21  4:23 AM  ?Result Value Ref Range  ? Lipase <10 (L) 11 - 51 U/L  ?  Comment: Performed at [facility],  [address], [place], [place] [address]  ?CBC with Differential     Status: Abnormal  ? Collection Time: 08/24/21  4:23 AM  ?Result Value Ref Range  ? WBC 11.2 (H) 4.0 - 10.5 K/uL  ? RBC 5.10 4.22 - 5.81 MIL/uL  ? Hemoglobin 15.5 13.0 - 17.0 g/dL  ? HCT 44.7 39.0 - 52.0 %  ? MCV 87.6 80.0 - 100.0 fL  ? MCH 30.4 26.0 - 34.0 pg  ? MCHC 34.7 30.0 - 36.0 g/dL  ? RDW 12.7 11.5 - 15.5 %  ? Platelets 255 150 - 400 K/uL  ? nRBC 0.0 0.0 - 0.2 %  ? Neutrophils Relative % 88 %  ? Neutro Abs 9.7 (H) 1.7 - 7.7 K/uL  ? Lymphocytes Relative 9 %  ? Lymphs Abs 1.0 0.7 - 4.0 K/uL  ? Monocytes Relative 3 %  ? Monocytes Absolute 0.4 0.1 - 1.0 K/uL  ? Eosinophils Relative 0 %  ? Eosinophils Absolute 0.0 0.0 - 0.5 K/uL  ? Basophils Relative 0 %  ? Basophils Absolute 0.0 0.0 - 0.1 K/uL  ? Immature Granulocytes 0 %  ? Abs Immature Granulocytes 0.02 0.00 - 0.07 K/uL  ?  Comment: Performed at [facility], [address], [place], [place] [address]  ?Lactic acid, plasma     Status: Abnormal  ? Collection Time: 08/24/21  5:17 AM  ?Result Value Ref Range  ? Lactic Acid, Venous 2.0 (HH) 0.5 - 1.9 mmol/L  ?  Comment: CRITICAL RESULT CALLED TO, READ BACK BY AND VERIFIED WITH: ?M,NEEDHAM AT 5701 ON 08/24/21 BY A,MOHAMED ?Performed at [facility], [address] [place], [place] [address] ?  ? ?CT Abdomen Pelvis W Contrast ? ?Result Date: 08/24/2021 ?CLINICAL DATA:  66 year old male with recurrent abdominal pain. CT Abdomen and Pelvis 5 days ago demonstrating cholelithiasis. EXAM: CT ABDOMEN AND PELVIS WITH CONTRAST TECHNIQUE: Multidetector CT imaging of the abdomen and pelvis was performed using the standard protocol following bolus administration of intravenous contrast. RADIATION DOSE REDUCTION: This exam was performed according to the departmental dose-optimization program which includes automated exposure control, adjustment of the mA and/or kV according to patient size and/or use of iterative  reconstruction technique. CONTRAST:  OMNIPAQUE IOHEXOL 300 MG/ML  SOLN COMPARISON:  CT Abdomen and Pelvis 08/19/2021. FINDINGS: Lower chest: Increased dependent atelectasis. No pericardial or pleural effusion. Hepatobiliary: Round 16 mm gallstone appears lodged in the neck of the gallbladder (coronal image 56 and series 2, image 23) with additional cholelithiasis and indistinct appearance of the gallbladder wall with evidence of pericholecystic inflammation or fluid in the gallbladder fossa (series 2, image 27 and coronal image 65). Common hepatic duct upstream of the gallbladder is at the upper limits of normal but there is mild if any intrahepatic biliary ductal dilatation. Liver parenchyma remains within normal limits. CBD is nondilated. Pancreas:  Negative. Spleen: Negative. Adrenals/Urinary Tract: Stable and negative; right upper pole benign appearing renal cyst (no follow-up imaging recommended). Stomach/Bowel: Large bowel and appendix appear stable and negative (appendix on coronal image 53). No dilated small bowel. Decompressed stomach and duodenum. No free air, free fluid, convincing mesenteric inflammation. Vascular/Lymphatic: Calcified aortic atherosclerosis. Major arterial structures remain patent. Portal venous system is patent. No lymphadenopathy. Reproductive: Negative. Other: No pelvic free fluid. Musculoskeletal: Stable visualized osseous structures. Disc degeneration and vacuum disc at the lumbosacral junction. IMPRESSION: 1. Evidence of Acute Cholecystitis and a 16 mm gallstone lodged in the neck of the gallbladder. Up to mild associated intrahepatic biliary ductal dilatation. 2. No other acute or inflammatory process identified in the abdomen or pelvis. Aortic Atherosclerosis (ICD10-I70.0). Electronically Signed   By: Odessa Fleming M.D.   On: 08/24/2021 06:28  ? ?DG Abd Acute W/Chest ? ?Result Date: 08/24/2021 ?CLINICAL DATA:  66 year old male with recurrent abdominal pain and nausea. EXAM: DG  ABDOMEN ACUTE WITH 1 VIEW CHEST COMPARISON:  CT Abdomen and Pelvis 08/19/2021. FINDINGS: PA upright view of the chest at 0448 hours. Lung volumes and mediastinal contours are normal. Visualized tracheal air

## 2021-08-24 NOTE — ED Notes (Signed)
He is ambulatory without difficulty. He articulates understanding that he is to immediately proceed to [facility] E.D. to be admitted and sent by them to the pre-op area. He leaves at this time with his nephew. ?

## 2021-08-24 NOTE — ED Notes (Signed)
Patient transported to CT via w/c at this time escorted by CT tech  ?

## 2021-08-24 NOTE — Discharge Instructions (Addendum)
[facility] DISCHARGE INSTRUCTIONS ? ?Activity ?No heavy lifting greater than 15 pounds for 4 weeks after surgery. ?Ok to shower in 24 hours, but do not bathe or submerge incisions underwater. ?Do not drive while taking narcotic pain medication. ? ?Wound Care ?Your incisions are covered with skin glue called Dermabond. This will peel off on its own over time. ?You may shower and allow warm soapy water to run over your incisions. Gently pat dry. ?Do not submerge your incision underwater. ?Monitor your incision for any new redness, tenderness, or drainage. ? ?When to Call us: ?Fever greater than 100.5 ?New redness, drainage, or swelling at incision site ?Severe pain, nausea, or vomiting ?Jaundice (yellowing of the whites of the eyes or skin) ? ?Follow-up ?You will have a follow up appointment in about 1 month in the surgery clinic. We will call you this week to schedule your appointment. This will be at the [facility] Surgery office at [address], [address], [place], [place]. Please arrive at least 15 minutes prior to your scheduled appointment time. ? ?For questions or concerns, please call the office at [phone]. ? ?

## 2021-08-24 NOTE — ED Triage Notes (Signed)
Pt via pov from home with recurrent abdominal pain and nausea since midnight. Pt was seen for the same on Monday morning; states he was better until midnight when the pain and nausea/vomiting began again. Last BM yesterday - normal. Pt alert & oriented, nad noted.  ?

## 2021-08-24 NOTE — Anesthesia Preprocedure Evaluation (Addendum)
Anesthesia Evaluation  ?Patient identified by MRN, date of birth, ID band ?Patient awake ? ? ? ?Reviewed: ?Allergy & Precautions, NPO status , Patient's Chart, lab work & pertinent test results ? ?History of Anesthesia Complications ?Negative for: history of anesthetic complications ? ?Airway ?Mallampati: II ? ?TM Distance: >3 FB ?Neck ROM: Full ? ? ? Dental ? ?(+) Dental Advisory Given, Chipped ?  ?Pulmonary ?Current Smoker and Patient abstained from smoking.,  ?  ?Pulmonary exam normal ? ? ? ? ? ? ? Cardiovascular ?negative cardio ROS ? ? ?Rhythm:Regular Rate:Tachycardia ? ? ?  ?Neuro/Psych ?negative neurological ROS ? negative psych ROS  ? GI/Hepatic ?negative GI ROS, Neg liver ROS,   ?Endo/Other  ?negative endocrine ROS ? Renal/GU ?negative Renal ROS  ? ?  ?Musculoskeletal ?negative musculoskeletal ROS ?(+)  ? Abdominal ?  ?Peds ? Hematology ?negative hematology ROS ?(+)   ?Anesthesia Other Findings ? ? Reproductive/Obstetrics ? ?  ? ? ? ? ? ? ? ? ? ? ? ? ? ?  ?  ? ? ? ? ? ? ? ?Anesthesia Physical ?Anesthesia Plan ? ?ASA: 2 ? ?Anesthesia Plan: General  ? ?Post-op Pain Management: Tylenol PO (pre-op)* and Celebrex PO (pre-op)*  ? ?Induction: Intravenous ? ?PONV Risk Score and Plan: 3 and Treatment may vary due to age or medical condition, Ondansetron, Dexamethasone and Midazolam ? ?Airway Management Planned: Oral ETT ? ?Additional Equipment: None ? ?Intra-op Plan:  ? ?Post-operative Plan: Extubation in OR ? ?Informed Consent: I have reviewed the patients History and Physical, chart, labs and discussed the procedure including the risks, benefits and alternatives for the proposed anesthesia with the patient or authorized representative who has indicated his/her understanding and acceptance.  ? ? ? ?Dental advisory given ? ?Plan Discussed with: CRNA and Anesthesiologist ? ?Anesthesia Plan Comments:   ? ? ? ? ? ?Anesthesia Quick Evaluation ? ?

## 2021-08-24 NOTE — ED Notes (Signed)
He is awake, alert and in no distress. Dr. Durwin Nora informs him of plan to go to [facility] to have cholecystectomy. He adamantly insists upon going p.o.v. to avoid transport cost, to which Dr. Durwin Nora accedes. Pt. Tells [place] that his nephew is here to provide transport to [facility]. 2nd antibiotic begun at this time.  ?

## 2021-08-24 NOTE — Transfer of Care (Signed)
Immediate Anesthesia Transfer of Care Note ? ?Patient: Jeffrey Green ? ?Procedure(s) Performed: LAPAROSCOPIC CHOLECYSTECTOMY ? ?Patient Location: PACU ? ?Anesthesia Type:General ? ?Level of Consciousness: drowsy ? ?Airway & Oxygen Therapy: Patient Spontanous Breathing ? ?Post-op Assessment: Report given to RN and Post -op Vital signs reviewed and stable ? ?Post vital signs: Reviewed and stable ? ?Last Vitals:  ?Vitals Value Taken Time  ?BP 150/82 08/24/21 1418  ?Temp    ?Pulse 64 08/24/21 1419  ?Resp 10 08/24/21 1419  ?SpO2 93 % 08/24/21 1419  ?Vitals shown include unvalidated device data. ? ?Last Pain:  ?Vitals:  ? 08/24/21 0949  ?TempSrc: Oral  ?PainSc: 0-No pain  ?   ? ?  ? ?Complications: No notable events documented. ?

## 2021-08-24 NOTE — Anesthesia Procedure Notes (Signed)
Procedure Name: Intubation ?Date/Time: 08/24/2021 12:51 PM ?Performed by: Kyung Rudd, CRNA ?Pre-anesthesia Checklist: Patient identified, Emergency Drugs available, Suction available and Patient being monitored ?Patient Re-evaluated:Patient Re-evaluated prior to induction ?Oxygen Delivery Method: Circle system utilized ?Preoxygenation: Pre-oxygenation with 100% oxygen ?Induction Type: IV induction ?Ventilation: Mask ventilation without difficulty and Oral airway inserted - appropriate to patient size ?Laryngoscope Size: Mac and 4 ?Grade View: Grade I ?Tube type: Oral ?Tube size: 7.5 mm ?Number of attempts: 1 ?Airway Equipment and Method: Stylet ?Placement Confirmation: ETT inserted through vocal cords under direct vision, positive ETCO2 and breath sounds checked- equal and bilateral ?Secured at: 22 cm ?Tube secured with: Tape ?Dental Injury: Teeth and Oropharynx as per pre-operative assessment  ? ? ? ? ?

## 2021-08-24 NOTE — ED Notes (Signed)
ED Provider at bedside. 

## 2021-08-24 NOTE — ED Notes (Signed)
Patient transported to X-ray via w/c by Xray tech   ?

## 2021-08-24 NOTE — ED Notes (Addendum)
Dr Karle Starch notified via secure chat of 2.0 lactic acid  ?

## 2021-08-24 NOTE — ED Notes (Signed)
Pt now returned from xray via w/c - remains awake and alert-reports pain "it's now easing off"; pt also reports nausea improving  ?

## 2021-08-25 ENCOUNTER — Encounter (HOSPITAL_COMMUNITY): Payer: Self-pay | Admitting: Surgery

## 2021-08-25 NOTE — Op Note (Signed)
Date: 08/24/21 ? ?Patient: Jeffrey Green ?MRN: [id number] ? ?Preoperative Diagnosis: Symptomatic cholelithiasis, possible acute cholecystitis ?Postoperative Diagnosis: Acute on chronic calculus cholecystitis ? ?Procedure: Laparoscopic cholecystectomy ? ?Surgeon: Michaelle Birks, MD ? ?EBL: Minimal ? ?Anesthesia: General endotracheal ? ?Specimens: Gallbladder ? ?Indications: Jeffrey Green is a 66 year old male who presented to the ED with several days of epigastric and right upper quadrant abdominal pain, associated with nausea and vomiting.  CT scan showed a stone in the neck of the gallbladder with mild stranding around the gallbladder, concerning for acute cholecystitis.  After discussion of the risks and benefits of surgery, he agreed to proceed with cholecystectomy. ? ?Findings: Gallbladder wall thickening with mild edema and a stone within the neck of the gallbladder, consistent with acute on chronic cholecystitis. ? ?Procedure details: Informed consent was obtained in the preoperative area prior to the procedure. The patient was brought to the operating room and placed on the table in the supine position.  General anesthesia was induced and appropriate lines and drains were placed for intraoperative monitoring. Perioperative antibiotics were administered per SCIP guidelines. The abdomen was prepped and draped in the usual sterile fashion. A pre-procedure timeout was taken verifying patient identity, surgical site and procedure to be performed. ? ?A small infraumbilical skin incision was made, the subcutaneous tissue was divided with cautery, and the umbilical stalk was grasped and elevated. The fascia was incised and the peritoneal cavity was directly visualized. A 72mm Hassan trocar was placed and the abdomen was insufflated. The peritoneal cavity was inspected with no evidence of visceral or vascular injury. Three 99mm ports were placed in the right subcostal margin, all under direct visualization. The fundus of the  gallbladder was grasped and retracted cephalad.  There were some omental adhesions of the gallbladder, which were carefully taken down with cautery.  The infundibulum was retracted laterally.  There was a stone lodged in the neck of the gallbladder, which made retraction somewhat difficult.  The cystic triangle was dissected out using cautery and blunt dissection, and the critical view of safety was obtained. The cystic artery and cystic duct were clipped and divided, leaving two clips behind on the cystic artery and cystic duct stumps. The gallbladder was taken off the liver using cautery. The specimen was placed in an endocatch bag. The surgical site was irrigated with saline until the effluent was clear. Hemostasis was achieved in the gallbladder fossa using cautery. The cystic duct and artery stumps were visually inspected and there was no evidence of bile leak or bleeding. The ports were removed under direct visualization and the abdomen was desufflated.  The specimen was removed via the umbilical port.  The umbilical port site fascia was closed with a 0 vicryl suture. The skin at all port sites was closed with 4-0 monocryl subcuticular suture. Dermabond was applied. ? ?The patient tolerated the procedure well with no apparent complications.  All counts were correct x2 at the end of the procedure. The patient was extubated and taken to PACU in stable condition. ? ?Michaelle Birks, MD ?08/25/21 ?6:04 AM ? ? ?

## 2021-08-26 ENCOUNTER — Other Ambulatory Visit: Payer: Self-pay

## 2021-08-27 LAB — SURGICAL PATHOLOGY

## 2021-12-04 ENCOUNTER — Encounter (HOSPITAL_COMMUNITY): Payer: Self-pay | Admitting: Cardiology

## 2021-12-04 ENCOUNTER — Inpatient Hospital Stay (HOSPITAL_COMMUNITY)
Admission: EM | Disposition: A | Discharge status: Home / Self Care | Payer: Self-pay | Source: Home / Self Care | Attending: Cardiology

## 2021-12-04 ENCOUNTER — Inpatient Hospital Stay (HOSPITAL_COMMUNITY)
Admission: EM | Admit: 2021-12-04 | Discharge: 2021-12-07 | DRG: 246 | Disposition: A | Discharge status: Home / Self Care | Payer: BC Managed Care – PPO | Attending: Cardiology | Admitting: Cardiology

## 2021-12-04 DIAGNOSIS — R079 Chest pain, unspecified: Secondary | ICD-10-CM | POA: Diagnosis not present

## 2021-12-04 DIAGNOSIS — I2102 ST elevation (STEMI) myocardial infarction involving left anterior descending coronary artery: Secondary | ICD-10-CM | POA: Diagnosis not present

## 2021-12-04 DIAGNOSIS — I110 Hypertensive heart disease with heart failure: Secondary | ICD-10-CM | POA: Diagnosis present

## 2021-12-04 DIAGNOSIS — E7800 Pure hypercholesterolemia, unspecified: Secondary | ICD-10-CM | POA: Diagnosis not present

## 2021-12-04 DIAGNOSIS — I213 ST elevation (STEMI) myocardial infarction of unspecified site: Secondary | ICD-10-CM | POA: Diagnosis not present

## 2021-12-04 DIAGNOSIS — I959 Hypotension, unspecified: Secondary | ICD-10-CM | POA: Diagnosis present

## 2021-12-04 DIAGNOSIS — Z955 Presence of coronary angioplasty implant and graft: Secondary | ICD-10-CM

## 2021-12-04 DIAGNOSIS — Z8249 Family history of ischemic heart disease and other diseases of the circulatory system: Secondary | ICD-10-CM

## 2021-12-04 DIAGNOSIS — I5021 Acute systolic (congestive) heart failure: Secondary | ICD-10-CM | POA: Diagnosis present

## 2021-12-04 DIAGNOSIS — M79603 Pain in arm, unspecified: Secondary | ICD-10-CM | POA: Diagnosis not present

## 2021-12-04 DIAGNOSIS — I10 Essential (primary) hypertension: Secondary | ICD-10-CM | POA: Diagnosis not present

## 2021-12-04 DIAGNOSIS — Z9049 Acquired absence of other specified parts of digestive tract: Secondary | ICD-10-CM

## 2021-12-04 DIAGNOSIS — R739 Hyperglycemia, unspecified: Secondary | ICD-10-CM | POA: Diagnosis present

## 2021-12-04 DIAGNOSIS — I255 Ischemic cardiomyopathy: Secondary | ICD-10-CM

## 2021-12-04 DIAGNOSIS — F17210 Nicotine dependence, cigarettes, uncomplicated: Secondary | ICD-10-CM | POA: Diagnosis present

## 2021-12-04 DIAGNOSIS — I2510 Atherosclerotic heart disease of native coronary artery without angina pectoris: Secondary | ICD-10-CM | POA: Diagnosis not present

## 2021-12-04 DIAGNOSIS — E785 Hyperlipidemia, unspecified: Secondary | ICD-10-CM

## 2021-12-04 HISTORY — PX: CORONARY/GRAFT ACUTE MI REVASCULARIZATION: CATH118305

## 2021-12-04 HISTORY — PX: LEFT HEART CATH AND CORONARY ANGIOGRAPHY: CATH118249

## 2021-12-04 LAB — POCT I-STAT, CHEM 8
BUN: 13 mg/dL (ref 8–23)
Calcium, Ion: 1.2 mmol/L (ref 1.15–1.40)
Chloride: 103 mmol/L (ref 98–111)
Creatinine, Ser: 0.9 mg/dL (ref 0.61–1.24)
Glucose, Bld: 149 mg/dL — ABNORMAL HIGH (ref 70–99)
HCT: 46 % (ref 39.0–52.0)
Hemoglobin: 15.6 g/dL (ref 13.0–17.0)
Potassium: 3.5 mmol/L (ref 3.5–5.1)
Sodium: 140 mmol/L (ref 135–145)
TCO2: 21 mmol/L — ABNORMAL LOW (ref 22–32)

## 2021-12-04 LAB — CBC WITH DIFFERENTIAL/PLATELET
Abs Immature Granulocytes: 0.03 10*3/uL (ref 0.00–0.07)
Basophils Absolute: 0.1 10*3/uL (ref 0.0–0.1)
Basophils Relative: 1 %
Eosinophils Absolute: 0.2 10*3/uL (ref 0.0–0.5)
Eosinophils Relative: 2 %
HCT: 44.7 % (ref 39.0–52.0)
Hemoglobin: 15.8 g/dL (ref 13.0–17.0)
Immature Granulocytes: 0 %
Lymphocytes Relative: 25 %
Lymphs Abs: 2.9 10*3/uL (ref 0.7–4.0)
MCH: 30.6 pg (ref 26.0–34.0)
MCHC: 35.3 g/dL (ref 30.0–36.0)
MCV: 86.6 fL (ref 80.0–100.0)
Monocytes Absolute: 0.6 10*3/uL (ref 0.1–1.0)
Monocytes Relative: 5 %
Neutro Abs: 7.8 10*3/uL — ABNORMAL HIGH (ref 1.7–7.7)
Neutrophils Relative %: 67 %
Platelets: 241 10*3/uL (ref 150–400)
RBC: 5.16 MIL/uL (ref 4.22–5.81)
RDW: 13 % (ref 11.5–15.5)
WBC: 11.6 10*3/uL — ABNORMAL HIGH (ref 4.0–10.5)
nRBC: 0 % (ref 0.0–0.2)

## 2021-12-04 LAB — COMPREHENSIVE METABOLIC PANEL
ALT: 17 U/L (ref 0–44)
AST: 19 U/L (ref 15–41)
Albumin: 4.5 g/dL (ref 3.5–5.0)
Alkaline Phosphatase: 68 U/L (ref 38–126)
Anion gap: 10 (ref 5–15)
BUN: 12 mg/dL (ref 8–23)
CO2: 23 mmol/L (ref 22–32)
Calcium: 9.3 mg/dL (ref 8.9–10.3)
Chloride: 107 mmol/L (ref 98–111)
Creatinine, Ser: 1.07 mg/dL (ref 0.61–1.24)
GFR, Estimated: >60 mL/min (ref >60)
Glucose, Bld: 144 mg/dL — ABNORMAL HIGH (ref 70–99)
Potassium: 3.5 mmol/L (ref 3.5–5.1)
Sodium: 140 mmol/L (ref 135–145)
Total Bilirubin: 1 mg/dL (ref 0.3–1.2)
Total Protein: 6.9 g/dL (ref 6.5–8.1)

## 2021-12-04 LAB — MAGNESIUM: Magnesium: 1.8 mg/dL (ref 1.7–2.4)

## 2021-12-04 LAB — POCT ACTIVATED CLOTTING TIME: Activated Clotting Time: 450 seconds

## 2021-12-04 LAB — BRAIN NATRIURETIC PEPTIDE: B Natriuretic Peptide: 51.4 pg/mL (ref 0.0–100.0)

## 2021-12-04 LAB — GLUCOSE, CAPILLARY: Glucose-Capillary: 148 mg/dL — ABNORMAL HIGH (ref 70–99)

## 2021-12-04 LAB — MRSA NEXT GEN BY PCR, NASAL: MRSA by PCR Next Gen: NOT DETECTED

## 2021-12-04 SURGERY — CORONARY/GRAFT ACUTE MI REVASCULARIZATION
Anesthesia: LOCAL

## 2021-12-04 MED ORDER — TICAGRELOR 90 MG PO TABS
180.0000 mg | ORAL_TABLET | Freq: Once | ORAL | Status: AC
Start: 2021-12-04 — End: 2021-12-04
  Administered 2021-12-04: 180 mg via ORAL
  Filled 2021-12-04: qty 2

## 2021-12-04 MED ORDER — HEPARIN (PORCINE) IN NACL 1000-0.9 UT/500ML-% IV SOLN
INTRAVENOUS | Status: AC
  Filled 2021-12-04: qty 500

## 2021-12-04 MED ORDER — ENOXAPARIN SODIUM 40 MG/0.4ML IJ SOSY
40.0000 mg | PREFILLED_SYRINGE | INTRAMUSCULAR | Status: DC
Start: 2021-12-05 — End: 2021-12-07
  Administered 2021-12-05 – 2021-12-07 (×3): 40 mg via SUBCUTANEOUS
  Filled 2021-12-04 (×3): qty 0.4

## 2021-12-04 MED ORDER — ATORVASTATIN CALCIUM 80 MG PO TABS
80.0000 mg | ORAL_TABLET | Freq: Every day | ORAL | Status: DC
  Administered 2021-12-04 – 2021-12-07 (×4): 80 mg via ORAL
  Filled 2021-12-04 (×4): qty 1

## 2021-12-04 MED ORDER — TICAGRELOR 90 MG PO TABS
ORAL_TABLET | ORAL | Status: DC | PRN
  Administered 2021-12-04 (×2): 180 mg via ORAL

## 2021-12-04 MED ORDER — METOPROLOL TARTRATE 12.5 MG HALF TABLET
12.5000 mg | ORAL_TABLET | Freq: Two times a day (BID) | ORAL | Status: DC
Start: 2021-12-05 — End: 2021-12-05

## 2021-12-04 MED ORDER — NITROGLYCERIN 1 MG/10 ML FOR IR/CATH LAB
INTRA_ARTERIAL | Status: AC
  Filled 2021-12-04: qty 10

## 2021-12-04 MED ORDER — ONDANSETRON HCL 4 MG/2ML IJ SOLN
INTRAMUSCULAR | Status: DC | PRN
  Administered 2021-12-04: 4 mg via INTRAVENOUS

## 2021-12-04 MED ORDER — SODIUM CHLORIDE 0.9% FLUSH: 3.0000 mL | INTRAVENOUS | Status: DC | PRN

## 2021-12-04 MED ORDER — ORAL CARE MOUTH RINSE: 15.0000 mL | OROMUCOSAL | Status: DC | PRN

## 2021-12-04 MED ORDER — NITROGLYCERIN 1 MG/10 ML FOR IR/CATH LAB
INTRA_ARTERIAL | Status: DC | PRN
  Administered 2021-12-04: 200 ug via INTRACORONARY

## 2021-12-04 MED ORDER — ASPIRIN 81 MG PO CHEW
81.0000 mg | CHEWABLE_TABLET | Freq: Every day | ORAL | Status: DC
  Administered 2021-12-05 – 2021-12-06 (×2): 81 mg via ORAL
  Filled 2021-12-04 (×2): qty 1

## 2021-12-04 MED ORDER — LISINOPRIL 5 MG PO TABS: 5.0000 mg | ORAL_TABLET | Freq: Every day | ORAL | Status: DC

## 2021-12-04 MED ORDER — VERAPAMIL HCL 2.5 MG/ML IV SOLN
INTRAVENOUS | Status: AC
  Filled 2021-12-04: qty 2

## 2021-12-04 MED ORDER — ACETAMINOPHEN 325 MG PO TABS
650.0000 mg | ORAL_TABLET | ORAL | Status: DC | PRN
Start: 2021-12-04 — End: 2021-12-04

## 2021-12-04 MED ORDER — SODIUM CHLORIDE 0.9 % WEIGHT BASED INFUSION
1.0000 mL/kg/h | INTRAVENOUS | Status: AC
  Administered 2021-12-04: 1 mL/kg/h via INTRAVENOUS

## 2021-12-04 MED ORDER — HEPARIN SODIUM (PORCINE) 1000 UNIT/ML IJ SOLN
INTRAMUSCULAR | Status: DC | PRN
  Administered 2021-12-04: 6000 [IU] via INTRAVENOUS

## 2021-12-04 MED ORDER — TICAGRELOR 90 MG PO TABS
ORAL_TABLET | ORAL | Status: AC
  Filled 2021-12-04: qty 2

## 2021-12-04 MED ORDER — HEPARIN (PORCINE) IN NACL 1000-0.9 UT/500ML-% IV SOLN
INTRAVENOUS | Status: DC | PRN
  Administered 2021-12-04 (×2): 500 mL

## 2021-12-04 MED ORDER — ONDANSETRON HCL 4 MG/2ML IJ SOLN: 4.0000 mg | Freq: Four times a day (QID) | INTRAMUSCULAR | Status: DC | PRN

## 2021-12-04 MED ORDER — HEPARIN SODIUM (PORCINE) 1000 UNIT/ML IJ SOLN
INTRAMUSCULAR | Status: AC
  Filled 2021-12-04: qty 10

## 2021-12-04 MED ORDER — SODIUM CHLORIDE 0.9% FLUSH
3.0000 mL | Freq: Two times a day (BID) | INTRAVENOUS | Status: DC
  Administered 2021-12-05 – 2021-12-07 (×5): 3 mL via INTRAVENOUS

## 2021-12-04 MED ORDER — HEPARIN SODIUM (PORCINE) 5000 UNIT/ML IJ SOLN
4000.0000 [IU] | Freq: Once | INTRAMUSCULAR | Status: DC
Start: 2021-12-04 — End: 2021-12-05

## 2021-12-04 MED ORDER — ATORVASTATIN CALCIUM 80 MG PO TABS: 80.0000 mg | ORAL_TABLET | Freq: Every day | ORAL | Status: DC

## 2021-12-04 MED ORDER — VERAPAMIL HCL 2.5 MG/ML IV SOLN
INTRAVENOUS | Status: DC | PRN
  Administered 2021-12-04: 10 mL via INTRA_ARTERIAL

## 2021-12-04 MED ORDER — VERAPAMIL HCL 2.5 MG/ML IV SOLN
INTRAVENOUS | Status: DC | PRN
  Administered 2021-12-04 (×5): 100 ug via INTRACORONARY

## 2021-12-04 MED ORDER — ONDANSETRON HCL 4 MG/2ML IJ SOLN
INTRAMUSCULAR | Status: AC
  Filled 2021-12-04: qty 2

## 2021-12-04 MED ORDER — TICAGRELOR 90 MG PO TABS
90.0000 mg | ORAL_TABLET | Freq: Two times a day (BID) | ORAL | Status: DC
  Administered 2021-12-05 – 2021-12-07 (×5): 90 mg via ORAL
  Filled 2021-12-04 (×5): qty 1

## 2021-12-04 MED ORDER — NITROGLYCERIN 0.4 MG SL SUBL: 0.4000 mg | SUBLINGUAL_TABLET | SUBLINGUAL | Status: DC | PRN

## 2021-12-04 MED ORDER — ACETAMINOPHEN 325 MG PO TABS
650.0000 mg | ORAL_TABLET | ORAL | Status: DC | PRN
  Administered 2021-12-04: 650 mg via ORAL
  Filled 2021-12-04: qty 2

## 2021-12-04 MED ORDER — ASPIRIN 81 MG PO CHEW
324.0000 mg | CHEWABLE_TABLET | Freq: Once | ORAL | Status: DC
Start: 2021-12-04 — End: 2021-12-05

## 2021-12-04 MED ORDER — SODIUM CHLORIDE 0.9 % IV SOLN
250.0000 mL | INTRAVENOUS | Status: DC | PRN
Start: 2021-12-05 — End: 2021-12-07

## 2021-12-04 MED ORDER — LIDOCAINE HCL (PF) 1 % IJ SOLN
INTRAMUSCULAR | Status: DC | PRN
  Administered 2021-12-04: 2 mL

## 2021-12-04 MED ORDER — LIDOCAINE HCL (PF) 1 % IJ SOLN
INTRAMUSCULAR | Status: AC
  Filled 2021-12-04: qty 30

## 2021-12-04 MED ORDER — CHLORHEXIDINE GLUCONATE CLOTH 2 % EX PADS
6.0000 | MEDICATED_PAD | Freq: Every day | CUTANEOUS | Status: DC
  Administered 2021-12-04 – 2021-12-05 (×2): 6 via TOPICAL

## 2021-12-04 MED ORDER — IOHEXOL 350 MG/ML SOLN
INTRAVENOUS | Status: DC | PRN
  Administered 2021-12-04: 145 mL

## 2021-12-04 SURGICAL SUPPLY — 21 items
BALL SAPPHIRE NC24 4.0X8 (BALLOONS) ×2
BALLN SAPPHIRE 2.5X12 (BALLOONS) ×2
BALLOON SAPPHIRE 2.5X12 (BALLOONS) IMPLANT
BALLOON SAPPHIRE NC24 4.0X8 (BALLOONS) IMPLANT
BAND CMPR LRG ZPHR (HEMOSTASIS) ×1
BAND ZEPHYR COMPRESS 30 LONG (HEMOSTASIS) ×1 IMPLANT
CATH 5FR JL3.5 JR4 ANG PIG MP (CATHETERS) ×1 IMPLANT
CATH INFINITI 5FR AL1 (CATHETERS) ×1 IMPLANT
CATH VISTA GUIDE 6FR XBLAD3.5 (CATHETERS) ×1 IMPLANT
ELECT DEFIB PAD ADLT CADENCE (PAD) ×1 IMPLANT
GLIDESHEATH SLEND SS 6F .021 (SHEATH) ×1 IMPLANT
GUIDEWIRE INQWIRE 1.5J.035X260 (WIRE) IMPLANT
INQWIRE 1.5J .035X260CM (WIRE) ×2
KIT HEART LEFT (KITS) ×3 IMPLANT
PACK CARDIAC CATHETERIZATION (CUSTOM PROCEDURE TRAY) ×3 IMPLANT
STENT SYNERGY XD 3.50X20 (Permanent Stent) IMPLANT
SYNERGY XD 3.50X20 (Permanent Stent) ×2 IMPLANT
TRANSDUCER W/STOPCOCK (MISCELLANEOUS) ×3 IMPLANT
TUBING CIL FLEX 10 FLL-RA (TUBING) ×3 IMPLANT
WIRE ASAHI PROWATER 180CM (WIRE) ×2 IMPLANT
WIRE HI TORQ VERSACORE-J 145CM (WIRE) ×1 IMPLANT

## 2021-12-04 NOTE — H&P (Signed)
Cardiology Admission History and Physical:   Patient ID: [name] MRN: [id number]; DOB: 02/08/1956   Admission date: 12/04/2021  PCP:  Pcp, No   [facility] Providers Cardiologist:  None       Chief Complaint:  Chest pain, STEMI  Patient Profile:   Jeffrey Green is a 66 y.o. male with PMH of tobacco use, acute cholecystitis s/p cholecystectomy who is being seen 12/04/2021 for the evaluation of STEMI.  History of Present Illness:   Mr. Caesar has limited history outside of active smoker, acute cholecystitis s/p cholecystectomy in April 2023. Patient was brought from home in [place] by EMS after developing acute onset chest pain this evening.   Briefly, patient with acute onset chest pain, diaphoresis at 7 pm this evening with associated diaphoresis. EMS called given pain. EKG obtained by EMS concerning for anterior STEMI with elevation in V1-V4. Cath lab was activated. Patient given heparin, ASA 325 mg.   LHC demonstrating 100% prox to mLAD occlusion s/p DES with TIMI3 flow and intermittent vasospasm.   Past Medical History:  Diagnosis Date   Medical history non-contributory     Past Surgical History:  Procedure Laterality Date   CHOLECYSTECTOMY N/A 08/24/2021   Procedure: LAPAROSCOPIC CHOLECYSTECTOMY;  Surgeon: Fritzi Mandes, MD;  Location: [facility] OR;  Service: General;  Laterality: N/A;    Medications Prior to Admission: Prior to Admission medications   Medication Sig Start Date End Date Taking? Authorizing Provider  ondansetron (ZOFRAN-ODT) 4 MG disintegrating tablet Take 1 tablet (4 mg total) by mouth every 8 (eight) hours as needed for nausea or vomiting. 08/19/21   Horton, Mayer Masker, MD  oxyCODONE (OXY IR/ROXICODONE) 5 MG immediate release tablet Take 1 tablet (5 mg total) by mouth every 6 (six) hours as needed for severe pain. 08/24/21   Fritzi Mandes, MD    Allergies:   No Known Allergies  Social History:   Social History   Socioeconomic History   Marital  status: Married    Spouse name: Not on file   Number of children: Not on file   Years of education: Not on file   Highest education level: Not on file  Occupational History   Not on file  Tobacco Use   Smoking status: Every Day    Packs/day: 1.00    Types: Cigarettes   Smokeless tobacco: Not on file  Vaping Use   Vaping Use: Never used  Substance and Sexual Activity   Alcohol use: Yes   Drug use: Not Currently   Sexual activity: Yes  Other Topics Concern   Not on file  Social History Narrative   Not on file   Social Determinants of Health   Financial Resource Strain: Not on file  Food Insecurity: Not on file  Transportation Needs: Not on file  Physical Activity: Not on file  Stress: Not on file  Social Connections: Not on file  Intimate Partner Violence: Not on file    Family History:   The patient's family history includes Heart disease in his father.    ROS:  Please see the history of present illness.  All other ROS reviewed and negative.     Physical Exam/Data:   Vitals:   12/04/21 2007  Weight: 76.2 kg   No intake or output data in the 24 hours ending 12/04/21 2107    12/04/2021    8:07 PM 08/24/2021    4:24 AM 08/18/2021   11:10 PM  Last 3 Weights  Weight (lbs) 168 lb  170 lb 170 lb  Weight (kg) 76.204 kg 77.111 kg 77.111 kg     Body mass index is 24.11 kg/m.  General:  Well nourished, well developed, in no acute distress HEENT: normal Neck: no JVD Vascular: No carotid bruits; Distal pulses 2+ bilaterally   Cardiac:  normal S1, S2; RRR; no murmur  Lungs:  clear to auscultation bilaterally, no wheezing, rhonchi or rales  Abd: soft, nontender, no hepatomegaly  Ext: no edema Musculoskeletal:  No deformities, BUE and BLE strength normal and equal Skin: warm and dry  Neuro:  CNs 2-12 intact, no focal abnormalities noted Psych:  Normal affect   EKG:  The ECG that was done was personally reviewed and demonstrates anterior STEMI  Relevant CV  Studies: EKG 08/20/2021 - NSR, no acute ischemic changes   Laboratory Data:  High Sensitivity Troponin:  No results for input(s): "TROPONINIHS" in the last 720 hours.    ChemistryNo results for input(s): "NA", "K", "CL", "CO2", "GLUCOSE", "BUN", "CREATININE", "CALCIUM", "MG", "GFRNONAA", "GFRAA", "ANIONGAP" in the last 168 hours.  No results for input(s): "PROT", "ALBUMIN", "AST", "ALT", "ALKPHOS", "BILITOT" in the last 168 hours. Lipids No results for input(s): "CHOL", "TRIG", "HDL", "LABVLDL", "LDLCALC", "CHOLHDL" in the last 168 hours. HematologyNo results for input(s): "WBC", "RBC", "HGB", "HCT", "MCV", "MCH", "MCHC", "RDW", "PLT" in the last 168 hours. Thyroid No results for input(s): "TSH", "FREET4" in the last 168 hours. BNPNo results for input(s): "BNP", "PROBNP" in the last 168 hours.  DDimer No results for input(s): "DDIMER" in the last 168 hours.  Radiology/Studies:  No results found.  Assessment and Plan:   Anterior STEMI Patient with ongoing tobacco use presenting with ACS with EKG demonstrating anterior STEMI s/p DES to p-mLAD.  - s/p ASA load, heparin - ASA 81 mg in AM - loaded with ticagrelor, continue 90 mg BID daily  - start atorvastatin 80 mg - post cath EKG, trop for post-reperfusion baseline  - lipid,TSH, lipo A, BMP orderd - TTE in AM - start ACEi pending BMP to ensure Cr and K stable - metoprolol tartrate 12.5 mg BID  - tobacco cessation counseling   Risk Assessment/Risk Scores:    TIMI Risk Score for ST  Elevation MI:   The patient's TIMI risk score is 1, which indicates a 1.6% risk of all cause mortality at 30 days.    Severity of Illness: The appropriate patient status for this patient is INPATIENT. Inpatient status is judged to be reasonable and necessary in order to provide the required intensity of service to ensure the patient's safety. The patient's presenting symptoms, physical exam findings, and initial radiographic and laboratory data in the  context of their chronic comorbidities is felt to place them at high risk for further clinical deterioration. Furthermore, it is not anticipated that the patient will be medically stable for discharge from the hospital within 2 midnights of admission.   * I certify that at the point of admission it is my clinical judgment that the patient will require inpatient hospital care spanning beyond 2 midnights from the point of admission due to high intensity of service, high risk for further deterioration and high frequency of surveillance required.*   For questions or updates, please contact [facility] Please consult [email or web address] for contact info under     Signed, Thana Ates, MD  12/04/2021 9:07 PM

## 2021-12-04 NOTE — ED Triage Notes (Addendum)
Pt BIB [facility] from home for chest pain. Code STEMI paged by EMS PTA.

## 2021-12-05 ENCOUNTER — Telehealth (HOSPITAL_COMMUNITY): Payer: Self-pay | Admitting: Pharmacy Technician

## 2021-12-05 ENCOUNTER — Inpatient Hospital Stay (HOSPITAL_COMMUNITY): Payer: BC Managed Care – PPO

## 2021-12-05 ENCOUNTER — Encounter (HOSPITAL_COMMUNITY): Payer: Self-pay | Admitting: Cardiology

## 2021-12-05 ENCOUNTER — Other Ambulatory Visit (HOSPITAL_COMMUNITY): Payer: Self-pay

## 2021-12-05 DIAGNOSIS — I2102 ST elevation (STEMI) myocardial infarction involving left anterior descending coronary artery: Secondary | ICD-10-CM

## 2021-12-05 DIAGNOSIS — I2510 Atherosclerotic heart disease of native coronary artery without angina pectoris: Secondary | ICD-10-CM | POA: Diagnosis not present

## 2021-12-05 LAB — LIPID PANEL
Cholesterol: 198 mg/dL (ref 0–200)
HDL: 41 mg/dL (ref >40)
LDL Cholesterol: 147 mg/dL — ABNORMAL HIGH (ref 0–99)
Total CHOL/HDL Ratio: 4.8 RATIO
Triglycerides: 49 mg/dL (ref <150)
VLDL: 10 mg/dL (ref 0–40)

## 2021-12-05 LAB — TROPONIN I (HIGH SENSITIVITY): Troponin I (High Sensitivity): >24000 ng/L (ref <18)

## 2021-12-05 LAB — CBC
HCT: 46.2 % (ref 39.0–52.0)
Hemoglobin: 15.9 g/dL (ref 13.0–17.0)
MCH: 30.4 pg (ref 26.0–34.0)
MCHC: 34.4 g/dL (ref 30.0–36.0)
MCV: 88.3 fL (ref 80.0–100.0)
Platelets: 192 10*3/uL (ref 150–400)
RBC: 5.23 MIL/uL (ref 4.22–5.81)
RDW: 13.1 % (ref 11.5–15.5)
WBC: 12.5 10*3/uL — ABNORMAL HIGH (ref 4.0–10.5)
nRBC: 0 % (ref 0.0–0.2)

## 2021-12-05 LAB — ECHOCARDIOGRAM COMPLETE
AR max vel: 2.89 cm2
AV Area VTI: 2.82 cm2
AV Area mean vel: 2.95 cm2
AV Mean grad: 2 mmHg
AV Peak grad: 3.2 mmHg
Ao pk vel: 0.89 m/s
Area-P 1/2: 3.68 cm2
Calc EF: 46.7 %
S' Lateral: 3.2 cm
Single Plane A2C EF: 45.2 %
Single Plane A4C EF: 46.5 %
Weight: 2688 oz

## 2021-12-05 LAB — BASIC METABOLIC PANEL
Anion gap: 9 (ref 5–15)
BUN: 10 mg/dL (ref 8–23)
CO2: 25 mmol/L (ref 22–32)
Calcium: 9.4 mg/dL (ref 8.9–10.3)
Chloride: 105 mmol/L (ref 98–111)
Creatinine, Ser: 1 mg/dL (ref 0.61–1.24)
GFR, Estimated: >60 mL/min (ref >60)
Glucose, Bld: 138 mg/dL — ABNORMAL HIGH (ref 70–99)
Potassium: 3.7 mmol/L (ref 3.5–5.1)
Sodium: 139 mmol/L (ref 135–145)

## 2021-12-05 LAB — HEMOGLOBIN A1C
Hgb A1c MFr Bld: 5.5 % (ref 4.8–5.6)
Mean Plasma Glucose: 111.15 mg/dL

## 2021-12-05 LAB — HIV ANTIBODY (ROUTINE TESTING W REFLEX): HIV Screen 4th Generation wRfx: NONREACTIVE

## 2021-12-05 LAB — TSH: TSH: 4.176 u[IU]/mL (ref 0.350–4.500)

## 2021-12-05 MED ORDER — POTASSIUM CHLORIDE CRYS ER 20 MEQ PO TBCR
40.0000 meq | EXTENDED_RELEASE_TABLET | Freq: Once | ORAL | Status: AC
Start: 2021-12-05 — End: 2021-12-05
  Administered 2021-12-05: 40 meq via ORAL
  Filled 2021-12-05: qty 2

## 2021-12-05 MED ORDER — METOPROLOL TARTRATE 25 MG PO TABS
25.0000 mg | ORAL_TABLET | Freq: Two times a day (BID) | ORAL | Status: DC
  Administered 2021-12-05 (×2): 25 mg via ORAL
  Filled 2021-12-05 (×2): qty 1

## 2021-12-05 MED ORDER — LOSARTAN POTASSIUM 25 MG PO TABS
25.0000 mg | ORAL_TABLET | Freq: Every day | ORAL | Status: DC
Start: 2021-12-05 — End: 2021-12-06
  Administered 2021-12-05: 25 mg via ORAL
  Filled 2021-12-05: qty 1

## 2021-12-05 MED FILL — Heparin Sod (Porcine)-NaCl IV Soln 1000 Unit/500ML-0.9%: INTRAVENOUS | Qty: 500 | Status: AC

## 2021-12-05 NOTE — Progress Notes (Signed)
  Echocardiogram 2D Echocardiogram has been performed.  Gerda Diss 12/05/2021, 11:47 AM

## 2021-12-05 NOTE — TOC Benefit Eligibility Note (Addendum)
Patient Product/process development scientist completed.    The patient is currently admitted and upon discharge could be taking Brilinta 90 mg.  The current 30 day co-pay is $35.00.   The patient is currently admitted and upon discharge could be taking Farxiga 10 mg.  The current 30 day co-pay is $35.00.   The patient is currently admitted and upon discharge could be taking Jardiance 10 mg.  The current 30 day co-pay is $35.00.   The patient is insured through [place] [facility]     Roland Earl, CPhT Pharmacy Patient Advocate Specialist [facility] Patient Advocate Team Direct Number: [phone]  Fax: [phone]

## 2021-12-05 NOTE — Progress Notes (Signed)
Progress Note  Patient Name: Jeffrey Green Date of Encounter: 12/05/2021  [facility] Cardiologist: None New- Dr Dylann Gallier [place]  Subjective   Feels well. Denies any chest pain or dyspnea.   Inpatient Medications    Scheduled Meds:  aspirin  81 mg Oral Daily   atorvastatin  80 mg Oral Daily   Chlorhexidine Gluconate Cloth  6 each Topical Daily   enoxaparin (LOVENOX) injection  40 mg Subcutaneous Q24H   losartan  25 mg Oral Daily   metoprolol tartrate  25 mg Oral BID   sodium chloride flush  3 mL Intravenous Q12H   ticagrelor  90 mg Oral BID   Continuous Infusions:  sodium chloride     PRN Meds: sodium chloride, acetaminophen, nitroGLYCERIN, ondansetron (ZOFRAN) IV, mouth rinse, sodium chloride flush   Vital Signs    Vitals:   12/05/21 0545 12/05/21 0600 12/05/21 0615 12/05/21 0630  BP: (!) 147/98 135/83 137/81 130/76  Pulse: 75 75 74 73  Resp: 15 19 16 17   Temp:      TempSrc:      SpO2: 95% 98% 95% 94%  Weight:        Intake/Output Summary (Last 24 hours) at 12/05/2021 0743 Last data filed at 12/05/2021 0600 Gross per 24 hour  Intake 558.8 ml  Output 620 ml  Net -61.2 ml      12/04/2021    8:07 PM 08/24/2021    4:24 AM 08/18/2021   11:10 PM  Last 3 Weights  Weight (lbs) 168 lb 170 lb 170 lb  Weight (kg) 76.204 kg 77.111 kg 77.111 kg      Telemetry    NSR with rare PVC - Personally Reviewed  ECG    NSR with Q waves V1-2, mild persistent ST elevation V1-4 - Personally Reviewed  Physical Exam   GEN: No acute distress.   Neck: No JVD Cardiac: RRR, no murmurs, rubs, or gallops.  Respiratory: Clear to auscultation bilaterally. GI: Soft, nontender, non-distended  MS: No edema; No deformity. Radial site without hematoma Neuro:  Nonfocal  Psych: Normal affect   Labs    High Sensitivity Troponin:  No results for input(s): "TROPONINIHS" in the last 720 hours.   Chemistry Recent Labs  Lab 12/04/21 2017 12/04/21 2134  NA 140 140  K 3.5 3.5  CL 103  107  CO2  --  23  GLUCOSE 149* 144*  BUN 13 12  CREATININE 0.90 1.07  CALCIUM  --  9.3  MG  --  1.8  PROT  --  6.9  ALBUMIN  --  4.5  AST  --  19  ALT  --  17  ALKPHOS  --  68  BILITOT  --  1.0  GFRNONAA  --  >60  ANIONGAP  --  10    Lipids No results for input(s): "CHOL", "TRIG", "HDL", "LABVLDL", "LDLCALC", "CHOLHDL" in the last 168 hours.  Hematology Recent Labs  Lab 12/04/21 2017 12/04/21 2134 12/05/21 0652  WBC  --  11.6* 12.5*  RBC  --  5.16 5.23  HGB 15.6 15.8 15.9  HCT 46.0 44.7 46.2  MCV  --  86.6 88.3  MCH  --  30.6 30.4  MCHC  --  35.3 34.4  RDW  --  13.0 13.1  PLT  --  241 192   Thyroid No results for input(s): "TSH", "FREET4" in the last 168 hours.  BNP Recent Labs  Lab 12/04/21 2134  BNP 51.4    DDimer No results  for input(s): "DDIMER" in the last 168 hours.   Radiology    CARDIAC CATHETERIZATION  Result Date: 12/04/2021   Prox RCA to Mid RCA lesion is 25% stenosed.   Prox LAD to Mid LAD lesion is 100% stenosed.   1st Diag lesion is 60% stenosed.   A drug-eluting stent was successfully placed using a SYNERGY XD 3.50X20.   Post intervention, there is a 0% residual stenosis.   LV end diastolic pressure is mildly elevated. 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 Slow flow resolved with IC verapamil. Moderate disease in the first diagonal will be treated medically. Continue DAPT for one year. Assess LV function with Echo.    Cardiac Studies   Procedures  Coronary/Graft Acute MI Revascularization  LEFT HEART CATH AND CORONARY ANGIOGRAPHY   Conclusion      Prox RCA to Mid RCA lesion is 25% stenosed.   Prox LAD to Mid LAD lesion is 100% stenosed.   1st Diag lesion is 60% stenosed.   A drug-eluting stent was successfully placed using a SYNERGY XD 3.50X20.   Post intervention, there is a 0% residual stenosis.   LV end diastolic pressure is mildly elevated.   Single  vessel occlusive CAD involving a large mid LAD Mildly elevated LVEDP 17 mm Hg Successful PCI of the LAD with DES. Slow flow resolved with IC verapamil. Moderate disease in the first diagonal will be treated medically.    Continue DAPT for one year. Assess LV function with Echo.    Coronary Diagrams  Diagnostic Dominance: Right  Intervention   Implants     Patient Profile     66 y.o. male with history of tobacco abuse presented with acute anterior STEMI  Assessment & Plan    Acute anterior STEMI. S/p emergent stenting of mid LAD across large diagonal. There was some plaque shift into ostial diagonal but maintained TIMI 3 flow and he was asymptomatic. Will treat medically. On DAPT for one year. High dose statin. Troponin pending this am. Will assess LV function by Echo. On beta blocker and ARB. Depending on EF may need to add aldactone and SGLT 2 inhibitor.  Elevated blood sugar. Will check A1c HTN. Newly Dx. No on ARB and beta blocker. Will titrate as tolerated. Lipid status unknown. Lipid panel pending. On high dose statin.  Tobacco abuse. Counseled on smoking cessation.   For questions or updates, please contact [facility] Please consult [email or web address] for contact info under        Signed, Sacoya Mcgourty [place], MD  12/05/2021, 7:43 AM

## 2021-12-05 NOTE — ED Provider Notes (Signed)
[facility] C[facility]OV[facility] Provider Note   CSN: [id number] Arrival date & time: 12/04/21  1948     History  Chief Complaint  Patient presents with   Code STEMI    Jeffrey Green is a 66 y.o. male.  HPI      66yo male with history of smoking presents with concern for chest pain as a CODE STEMI with EMS.  Family hx of heart disease. Hx of cholecystectomy  At 7PM developed chest pain with radiation to arm and diaphoresis. Pain was severe, has improved after administration of nitroglycerin by EMS.  No dyspnea, nausea, vomiting, abdominal pain, cough, leg pain.  Pain began more severe 8/10, improved now 3/10.     Home Medications Prior to Admission medications   Medication Sig Start Date End Date Taking? Authorizing Provider  ondansetron (ZOFRAN-ODT) 4 MG disintegrating tablet Take 1 tablet (4 mg total) by mouth every 8 (eight) hours as needed for nausea or vomiting. 08/19/21   Horton, Mayer Masker, MD  oxyCODONE (OXY IR/ROXICODONE) 5 MG immediate release tablet Take 1 tablet (5 mg total) by mouth every 6 (six) hours as needed for severe pain. 08/24/21   Fritzi Mandes, MD      Allergies    Patient has no known allergies.    Review of Systems   Review of Systems  Physical Exam Updated Vital Signs BP (!) 146/83 (BP Location: Left Arm)   Pulse 90   Temp 98.8 F (37.1 C) (Oral)   Resp 12   Wt 76.2 kg   SpO2 98%   BMI 24.11 kg/m  Physical Exam Vitals and nursing note reviewed.  Constitutional:      General: He is not in acute distress.    Appearance: He is well-developed. He is not diaphoretic.  HENT:     Head: Normocephalic and atraumatic.  Eyes:     Conjunctiva/sclera: Conjunctivae normal.  Cardiovascular:     Rate and Rhythm: Normal rate and regular rhythm.     Heart sounds: Normal heart sounds. No murmur heard.    No friction rub. No gallop.  Pulmonary:     Effort: Pulmonary effort is normal. No respiratory distress.     Breath sounds: Normal breath  sounds. No wheezing or rales.  Abdominal:     General: There is no distension.     Palpations: Abdomen is soft.     Tenderness: There is no abdominal tenderness. There is no guarding.  Musculoskeletal:     Cervical back: Normal range of motion.  Skin:    General: Skin is warm and dry.  Neurological:     Mental Status: He is alert and oriented to person, place, and time.     ED Results / Procedures / Treatments   Labs (all labs ordered are listed, but only abnormal results are displayed) Labs Reviewed  GLUCOSE, CAPILLARY - Abnormal; Notable for the following components:      Result Value   Glucose-Capillary 148 (*)    All other components within normal limits  CBC WITH DIFFERENTIAL/PLATELET - Abnormal; Notable for the following components:   WBC 11.6 (*)    Neutro Abs 7.8 (*)    All other components within normal limits  COMPREHENSIVE METABOLIC PANEL - Abnormal; Notable for the following components:   Glucose, Bld 144 (*)    All other components within normal limits  LIPID PANEL - Abnormal; Notable for the following components:   LDL Cholesterol 147 (*)    All other components within  normal limits  BASIC METABOLIC PANEL - Abnormal; Notable for the following components:   Glucose, Bld 138 (*)    All other components within normal limits  CBC - Abnormal; Notable for the following components:   WBC 12.5 (*)    All other components within normal limits  POCT I-STAT, CHEM 8 - Abnormal; Notable for the following components:   Glucose, Bld 149 (*)    TCO2 21 (*)    All other components within normal limits  TROPONIN I (HIGH SENSITIVITY) - Abnormal; Notable for the following components:   Troponin I (High Sensitivity) >24,000 (*)    All other components within normal limits  MRSA NEXT GEN BY PCR, NASAL  BRAIN NATRIURETIC PEPTIDE  MAGNESIUM  HIV ANTIBODY (ROUTINE TESTING W REFLEX)  TSH  HEMOGLOBIN A1C  LIPOPROTEIN A (LPA)  CBC  POCT ACTIVATED CLOTTING TIME     EKG None  Radiology ECHOCARDIOGRAM COMPLETE  Result Date: 12/05/2021    ECHOCARDIOGRAM REPORT   Patient Name:   Jeffrey Green Date of Exam: 12/05/2021 Medical Rec #:  [id number]   Height:       70.0 in Accession #:    [id number]  Weight:       168.0 lb Date of Birth:  November 19, 1955   BSA:          1.938 m Patient Age:    66 years    BP:           146/83 mmHg Patient Gender: M           HR:           74 bpm. Exam Location:  Inpatient Procedure: 2D Echo, Cardiac Doppler and Color Doppler Indications:    Acute myocardial infarction  History:        Patient has no prior history of Echocardiogram examinations.                 Acute MI; Risk Factors:Current Smoker and Hypertension.  Sonographer:    Ross LudwigArthur Guy RDCS (AE) Referring Phys: [phone] PETER M [place]andJORDAN IMPRESSIONS  1. Left ventricular ejection fraction, by estimation, is 30 to 35%. The left ventricle has moderately decreased function. The left ventricle demonstrates regional wall motion abnormalities with mid to apical anteroseptal and inferoseptal akinesis; akinesis of the apical inferior, lateral, and anterior walls; and akinesis of the true apex. This is suggestive of LAD-territory MI. No LV thrombus noted. There is mild left ventricular hypertrophy. Left ventricular diastolic parameters are consistent with Grade I diastolic dysfunction (impaired relaxation).  2. Right ventricular systolic function is normal. The right ventricular size is normal. Tricuspid regurgitation signal is inadequate for assessing PA pressure.  3. The mitral valve is normal in structure. Trivial mitral valve regurgitation. No evidence of mitral stenosis.  4. The aortic valve was not well visualized. Aortic valve regurgitation is not visualized. No aortic stenosis is present.  5. The inferior vena cava is normal in size with <50% respiratory variability, suggesting right atrial pressure of 8 mmHg. FINDINGS  Left Ventricle: Left ventricular ejection fraction, by estimation, is 30 to 35%.  The left ventricle has moderately decreased function. The left ventricle demonstrates regional wall motion abnormalities. The left ventricular internal cavity size was normal in size. There is mild left ventricular hypertrophy. Left ventricular diastolic parameters are consistent with Grade I diastolic dysfunction (impaired relaxation). Right Ventricle: The right ventricular size is normal. No increase in right ventricular wall thickness. Right ventricular systolic function is normal. Tricuspid regurgitation signal is  inadequate for assessing PA pressure. Left Atrium: Left atrial size was normal in size. Right Atrium: Right atrial size was normal in size. Pericardium: There is no evidence of pericardial effusion. Mitral Valve: The mitral valve is normal in structure. Trivial mitral valve regurgitation. No evidence of mitral valve stenosis. Tricuspid Valve: The tricuspid valve is normal in structure. Tricuspid valve regurgitation is not demonstrated. Aortic Valve: The aortic valve was not well visualized. Aortic valve regurgitation is not visualized. No aortic stenosis is present. Aortic valve mean gradient measures 2.0 mmHg. Aortic valve peak gradient measures 3.2 mmHg. Aortic valve area, by VTI measures 2.82 cm. Pulmonic Valve: The pulmonic valve was normal in structure. Pulmonic valve regurgitation is not visualized. Aorta: The aortic root is normal in size and structure. Venous: The inferior vena cava is normal in size with less than 50% respiratory variability, suggesting right atrial pressure of 8 mmHg. IAS/Shunts: No atrial level shunt detected by color flow Doppler.  LEFT VENTRICLE PLAX 2D LVIDd:         4.60 cm      Diastology LVIDs:         3.20 cm      LV e' medial:    6.31 cm/s LV PW:         0.90 cm      LV E/e' medial:  8.8 LV IVS:        1.30 cm      LV e' lateral:   8.49 cm/s LVOT diam:     2.00 cm      LV E/e' lateral: 6.5 LV SV:         52 LV SV Index:   27 LVOT Area:     3.14 cm  LV Volumes (MOD)  LV vol d, MOD A2C: 112.0 ml LV vol d, MOD A4C: 121.0 ml LV vol s, MOD A2C: 61.4 ml LV vol s, MOD A4C: 64.7 ml LV SV MOD A2C:     50.6 ml LV SV MOD A4C:     121.0 ml LV SV MOD BP:      55.1 ml RIGHT VENTRICLE             IVC RV Basal diam:  2.60 cm     IVC diam: 1.70 cm RV S prime:     10.70 cm/s TAPSE (M-mode): 2.2 cm LEFT ATRIUM             Index        RIGHT ATRIUM           Index LA diam:        3.40 cm 1.75 cm/m   RA Area:     10.80 cm LA Vol (A2C):   48.5 ml 25.02 ml/m  RA Volume:   24.80 ml  12.80 ml/m LA Vol (A4C):   44.5 ml 22.96 ml/m LA Biplane Vol: 48.9 ml 25.23 ml/m  AORTIC VALVE AV Area (Vmax):    2.89 cm AV Area (Vmean):   2.95 cm AV Area (VTI):     2.82 cm AV Vmax:           89.30 cm/s AV Vmean:          60.900 cm/s AV VTI:            0.183 m AV Peak Grad:      3.2 mmHg AV Mean Grad:      2.0 mmHg LVOT Vmax:         82.10 cm/s LVOT Vmean:  57.200 cm/s LVOT VTI:          0.164 m LVOT/AV VTI ratio: 0.90  AORTA Ao Root diam: 2.90 cm Ao Asc diam:  2.90 cm MITRAL VALVE MV Area (PHT): 3.68 cm    SHUNTS MV Decel Time: 206 msec    Systemic VTI:  0.16 m MV E velocity: 55.30 cm/s  Systemic Diam: 2.00 cm MV A velocity: 50.60 cm/s MV E/A ratio:  1.09 Dalton McleanMD Electronically signed by Wilfred Lacy Signature Date/Time: 12/05/2021/4:02:45 PM    Final    CARDIAC CATHETERIZATION  Result Date: 12/04/2021   Prox RCA to Mid RCA lesion is 25% stenosed.   Prox LAD to Mid LAD lesion is 100% stenosed.   1st Diag lesion is 60% stenosed.   A drug-eluting stent was successfully placed using a SYNERGY XD [id number].   Post intervention, there is a 0% residual stenosis.   LV end diastolic pressure is mildly elevated. 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 Slow flow resolved with IC verapamil. Moderate disease in the first diagonal will be treated medically. Continue DAPT for one year. Assess LV function with Echo.     Procedures .Critical Care  Performed by: Alvira Monday, MD Authorized by: Alvira Monday, MD   Critical care provider statement:    Critical care time (minutes):  20   Critical care was time spent personally by me on the following activities:  Development of treatment plan with patient or surrogate, discussions with consultants, evaluation of patient's response to treatment, examination of patient, ordering and review of laboratory studies, ordering and review of radiographic studies, ordering and performing treatments and interventions, pulse oximetry, re-evaluation of patient's condition and review of old charts     Medications Ordered in ED Medications  nitroGLYCERIN (NITROSTAT) SL tablet 0.4 mg (has no administration in time range)  acetaminophen (TYLENOL) tablet 650 mg (650 mg Oral Given 12/04/21 2341)  ondansetron (ZOFRAN) injection 4 mg (has no administration in time range)  enoxaparin (LOVENOX) injection 40 mg (40 mg Subcutaneous Given 12/05/21 0830)  0.9% sodium chloride infusion (0 mL/kg/hr  76.2 kg Intravenous Stopped 12/05/21 0512)  sodium chloride flush (NS) 0.9 % injection 3 mL (3 mLs Intravenous Given 12/05/21 0830)  sodium chloride flush (NS) 0.9 % injection 3 mL (has no administration in time range)  0.9 %  sodium chloride infusion (has no administration in time range)  aspirin chewable tablet 81 mg (81 mg Oral Given 12/05/21 1030)  ticagrelor (BRILINTA) tablet 90 mg (90 mg Oral Given 12/05/21 1031)  atorvastatin (LIPITOR) tablet 80 mg (80 mg Oral Given 12/05/21 1030)  Chlorhexidine Gluconate Cloth 2 % PADS 6 each (6 each Topical Given 12/05/21 1030)  Oral care mouth rinse (has no administration in time range)  losartan (COZAAR) tablet 25 mg (25 mg Oral Given 12/05/21 1030)  metoprolol tartrate (LOPRESSOR) tablet 25 mg (25 mg Oral Given 12/05/21 1030)  ticagrelor (BRILINTA) tablet 180 mg (180 mg Oral Given 12/04/21 2155)  potassium chloride SA (KLOR-CON M) CR tablet 40  mEq (40 mEq Oral Given 12/05/21 1033)    ED Course/ Medical Decision Making/ A&P                           Medical Decision Making  66yo male with history of smoking presents with concern for chest pain as a CODE STEMI with EMS.  Dr. [place]  Cardiology present on patient arrival to ED.  Given aspirin/nitro by EMS with improvement in discomfort. VS stable with EMS. Given heparin bolus. Taken to cath lab.          Final Clinical Impression(s) / ED Diagnoses Final diagnoses:  ST elevation myocardial infarction involving left anterior descending (LAD) coronary artery [facility])  Status post coronary artery stent placement    Rx / DC Orders ED Discharge Orders          Ordered    Amb Referral to Cardiac Rehabilitation        12/05/21 1457              Alvira Monday, MD 12/05/21 1617

## 2021-12-05 NOTE — Telephone Encounter (Addendum)
Pharmacy Patient Advocate Encounter  Insurance verification completed.    The patient is insured through [employer]   The patient is currently admitted and ran test claims for the following: Brilinta, Farxiga, [place]  Copays and coinsurance results were relayed to Inpatient clinical team.

## 2021-12-05 NOTE — Progress Notes (Signed)
CARDIAC REHAB PHASE I   PRE:  Rate/Rhythm: 83 SR  BP:  Sitting: 129/80      SaO2: 98 RA  MODE:  Ambulation: 370 ft   POST:  Rate/Rhythm: 108 ST  BP:  Sitting: 124/86      SaO2: 99 RA  Pt ambulated independently with no CP or SOB. Back to bed with call bell and bedside table in reach. Post stent education including risk factors, exercise guidelines, site care, MI booklet, restrictions, heart healthy diet, smoking cessation, asa/Brilinta importance, and CRP2. Pt not sure if he is interested in CRP2. He and wife will discuss. Will refer to [facility] For CRP2. All questions and concerns addressed with pt and wife. Will continue to follow.   [id number]  Woodroe Chen, RN BSN 12/05/2021 3:00 PM

## 2021-12-06 ENCOUNTER — Other Ambulatory Visit (HOSPITAL_COMMUNITY): Payer: Self-pay

## 2021-12-06 ENCOUNTER — Other Ambulatory Visit: Payer: Self-pay

## 2021-12-06 DIAGNOSIS — I2102 ST elevation (STEMI) myocardial infarction involving left anterior descending coronary artery: Secondary | ICD-10-CM | POA: Diagnosis not present

## 2021-12-06 LAB — LIPOPROTEIN A (LPA): Lipoprotein (a): 152.6 nmol/L — ABNORMAL HIGH (ref <75.0)

## 2021-12-06 LAB — BASIC METABOLIC PANEL
Anion gap: 12 (ref 5–15)
BUN: 12 mg/dL (ref 8–23)
CO2: 22 mmol/L (ref 22–32)
Calcium: 8.9 mg/dL (ref 8.9–10.3)
Chloride: 104 mmol/L (ref 98–111)
Creatinine, Ser: 1.03 mg/dL (ref 0.61–1.24)
GFR, Estimated: >60 mL/min (ref >60)
Glucose, Bld: 116 mg/dL — ABNORMAL HIGH (ref 70–99)
Potassium: 3.6 mmol/L (ref 3.5–5.1)
Sodium: 138 mmol/L (ref 135–145)

## 2021-12-06 MED ORDER — POTASSIUM CHLORIDE CRYS ER 20 MEQ PO TBCR
40.0000 meq | EXTENDED_RELEASE_TABLET | Freq: Once | ORAL | Status: AC
Start: 2021-12-06 — End: 2021-12-06
  Administered 2021-12-06: 40 meq via ORAL
  Filled 2021-12-06: qty 2

## 2021-12-06 MED ORDER — DAPAGLIFLOZIN PROPANEDIOL 10 MG PO TABS
10.0000 mg | ORAL_TABLET | Freq: Every day | ORAL | Status: DC
  Administered 2021-12-06 – 2021-12-07 (×2): 10 mg via ORAL
  Filled 2021-12-06 (×2): qty 1

## 2021-12-06 MED ORDER — LOSARTAN POTASSIUM 25 MG PO TABS
12.5000 mg | ORAL_TABLET | Freq: Every day | ORAL | Status: DC
  Administered 2021-12-06 – 2021-12-07 (×2): 12.5 mg via ORAL
  Filled 2021-12-06 (×2): qty 1

## 2021-12-06 MED ORDER — METOPROLOL SUCCINATE ER 25 MG PO TB24
25.0000 mg | ORAL_TABLET | Freq: Every day | ORAL | Status: DC
  Administered 2021-12-06: 25 mg via ORAL
  Filled 2021-12-06: qty 1

## 2021-12-06 MED ORDER — TICAGRELOR 90 MG PO TABS
90.0000 mg | ORAL_TABLET | Freq: Two times a day (BID) | ORAL | 11 refills | Status: DC
  Filled 2021-12-06: qty 60, 30d supply, fill #0

## 2021-12-06 NOTE — Progress Notes (Signed)
Progress Note  Patient Name: Jeffrey Green Date of Encounter: 12/06/2021  [facility] Cardiologist: Derrious Bologna [place], MD New- Dr Everett Ricciardelli [place]  Subjective   Feels well. Denies any chest pain or dyspnea. No dizziness  Inpatient Medications    Scheduled Meds:  aspirin  81 mg Oral Daily   atorvastatin  80 mg Oral Daily   Chlorhexidine Gluconate Cloth  6 each Topical Daily   dapagliflozin propanediol  10 mg Oral Daily   enoxaparin (LOVENOX) injection  40 mg Subcutaneous Q24H   losartan  12.5 mg Oral Daily   metoprolol succinate  25 mg Oral Daily   potassium chloride  40 mEq Oral Once   sodium chloride flush  3 mL Intravenous Q12H   ticagrelor  90 mg Oral BID   Continuous Infusions:  sodium chloride     PRN Meds: sodium chloride, acetaminophen, nitroGLYCERIN, ondansetron (ZOFRAN) IV, mouth rinse, sodium chloride flush   Vital Signs    Vitals:   12/06/21 0000 12/06/21 0100 12/06/21 0400 12/06/21 0800  BP: (!) 86/57 96/61 91/71  (!) 86/67  Pulse: 72 73 72 83  Resp: 16 15 14 11   Temp: 98.6 F (37 C)  98.8 F (37.1 C)   TempSrc: Oral  Oral   SpO2: 96% 96% 96% 97%  Weight:        Intake/Output Summary (Last 24 hours) at 12/06/2021 0807 Last data filed at 12/06/2021 0737 Gross per 24 hour  Intake 360 ml  Output --  Net 360 ml       12/04/2021    8:07 PM 08/24/2021    4:24 AM 08/18/2021   11:10 PM  Last 3 Weights  Weight (lbs) 168 lb 170 lb 170 lb  Weight (kg) 76.204 kg 77.111 kg 77.111 kg      Telemetry    NSR rare PVC - Personally Reviewed  ECG    NSR with Q waves V1-2, mild persistent ST elevation V1-4 - Personally Reviewed  Physical Exam   GEN: No acute distress.   Neck: No JVD Cardiac: RRR, no murmurs, rubs, or gallops.  Respiratory: Clear to auscultation bilaterally. GI: Soft, nontender, non-distended  MS: No edema; No deformity. Radial site without hematoma Neuro:  Nonfocal  Psych: Normal affect   Labs    High Sensitivity Troponin:   Recent  Labs  Lab 12/05/21 0652  TROPONINIHS >24,000*     Chemistry Recent Labs  Lab 12/04/21 2134 12/05/21 0652 12/06/21 0716  NA 140 139 138  K 3.5 3.7 3.6  CL 107 105 104  CO2 23 25 22   GLUCOSE 144* 138* 116*  BUN 12 10 12   CREATININE 1.07 1.00 1.03  CALCIUM 9.3 9.4 8.9  MG 1.8  --   --   PROT 6.9  --   --   ALBUMIN 4.5  --   --   AST 19  --   --   ALT 17  --   --   ALKPHOS 68  --   --   BILITOT 1.0  --   --   GFRNONAA >60 >60 >60  ANIONGAP 10 9 12      Lipids  Recent Labs  Lab 12/05/21 0652  CHOL 198  TRIG 49  HDL 41  LDLCALC 147*  CHOLHDL 4.8    Hematology Recent Labs  Lab 12/04/21 2017 12/04/21 2134 12/05/21 0652  WBC  --  11.6* 12.5*  RBC  --  5.16 5.23  HGB 15.6 15.8 15.9  HCT 46.0 44.7 46.2  MCV  --  86.6 88.3  MCH  --  30.6 30.4  MCHC  --  35.3 34.4  RDW  --  13.0 13.1  PLT  --  241 192    Thyroid  Recent Labs  Lab 12/05/21 0652  TSH 4.176    BNP Recent Labs  Lab 12/04/21 2134  BNP 51.4     DDimer No results for input(s): "DDIMER" in the last 168 hours.   Radiology    ECHOCARDIOGRAM COMPLETE  Result Date: 12/05/2021    ECHOCARDIOGRAM REPORT   Patient Name:   Boris Cassis Date of Exam: 12/05/2021 Medical Rec #:  [id number]   Height:       70.0 in Accession #:    [id number]  Weight:       168.0 lb Date of Birth:  11-30-1955   BSA:          1.938 m Patient Age:    66 years    BP:           146/83 mmHg Patient Gender: M           HR:           74 bpm. Exam Location:  Inpatient Procedure: 2D Echo, Cardiac Doppler and Color Doppler Indications:    Acute myocardial infarction  History:        Patient has no prior history of Echocardiogram examinations.                 Acute MI; Risk Factors:Current Smoker and Hypertension.  Sonographer:    Ross Ludwig RDCS (AE) Referring Phys: [phone] Arian Murley M [place] IMPRESSIONS  1. Left ventricular ejection fraction, by estimation, is 30 to 35%. The left ventricle has moderately decreased function. The left ventricle  demonstrates regional wall motion abnormalities with mid to apical anteroseptal and inferoseptal akinesis; akinesis of the apical inferior, lateral, and anterior walls; and akinesis of the true apex. This is suggestive of LAD-territory MI. No LV thrombus noted. There is mild left ventricular hypertrophy. Left ventricular diastolic parameters are consistent with Grade I diastolic dysfunction (impaired relaxation).  2. Right ventricular systolic function is normal. The right ventricular size is normal. Tricuspid regurgitation signal is inadequate for assessing PA pressure.  3. The mitral valve is normal in structure. Trivial mitral valve regurgitation. No evidence of mitral stenosis.  4. The aortic valve was not well visualized. Aortic valve regurgitation is not visualized. No aortic stenosis is present.  5. The inferior vena cava is normal in size with <50% respiratory variability, suggesting right atrial pressure of 8 mmHg. FINDINGS  Left Ventricle: Left ventricular ejection fraction, by estimation, is 30 to 35%. The left ventricle has moderately decreased function. The left ventricle demonstrates regional wall motion abnormalities. The left ventricular internal cavity size was normal in size. There is mild left ventricular hypertrophy. Left ventricular diastolic parameters are consistent with Grade I diastolic dysfunction (impaired relaxation). Right Ventricle: The right ventricular size is normal. No increase in right ventricular wall thickness. Right ventricular systolic function is normal. Tricuspid regurgitation signal is inadequate for assessing PA pressure. Left Atrium: Left atrial size was normal in size. Right Atrium: Right atrial size was normal in size. Pericardium: There is no evidence of pericardial effusion. Mitral Valve: The mitral valve is normal in structure. Trivial mitral valve regurgitation. No evidence of mitral valve stenosis. Tricuspid Valve: The tricuspid valve is normal in structure.  Tricuspid valve regurgitation is not demonstrated. Aortic Valve: The aortic valve was not well visualized. Aortic valve regurgitation is  not visualized. No aortic stenosis is present. Aortic valve mean gradient measures 2.0 mmHg. Aortic valve peak gradient measures 3.2 mmHg. Aortic valve area, by VTI measures 2.82 cm. Pulmonic Valve: The pulmonic valve was normal in structure. Pulmonic valve regurgitation is not visualized. Aorta: The aortic root is normal in size and structure. Venous: The inferior vena cava is normal in size with less than 50% respiratory variability, suggesting right atrial pressure of 8 mmHg. IAS/Shunts: No atrial level shunt detected by color flow Doppler.  LEFT VENTRICLE PLAX 2D LVIDd:         4.60 cm      Diastology LVIDs:         3.20 cm      LV e' medial:    6.31 cm/s LV PW:         0.90 cm      LV E/e' medial:  8.8 LV IVS:        1.30 cm      LV e' lateral:   8.49 cm/s LVOT diam:     2.00 cm      LV E/e' lateral: 6.5 LV SV:         52 LV SV Index:   27 LVOT Area:     3.14 cm  LV Volumes (MOD) LV vol d, MOD A2C: 112.0 ml LV vol d, MOD A4C: 121.0 ml LV vol s, MOD A2C: 61.4 ml LV vol s, MOD A4C: 64.7 ml LV SV MOD A2C:     50.6 ml LV SV MOD A4C:     121.0 ml LV SV MOD BP:      55.1 ml RIGHT VENTRICLE             IVC RV Basal diam:  2.60 cm     IVC diam: 1.70 cm RV S prime:     10.70 cm/s TAPSE (M-mode): 2.2 cm LEFT ATRIUM             Index        RIGHT ATRIUM           Index LA diam:        3.40 cm 1.75 cm/m   RA Area:     10.80 cm LA Vol (A2C):   48.5 ml 25.02 ml/m  RA Volume:   24.80 ml  12.80 ml/m LA Vol (A4C):   44.5 ml 22.96 ml/m LA Biplane Vol: 48.9 ml 25.23 ml/m  AORTIC VALVE AV Area (Vmax):    2.89 cm AV Area (Vmean):   2.95 cm AV Area (VTI):     2.82 cm AV Vmax:           89.30 cm/s AV Vmean:          60.900 cm/s AV VTI:            0.183 m AV Peak Grad:      3.2 mmHg AV Mean Grad:      2.0 mmHg LVOT Vmax:         82.10 cm/s LVOT Vmean:        57.200 cm/s LVOT VTI:           0.164 m LVOT/AV VTI ratio: 0.90  AORTA Ao Root diam: 2.90 cm Ao Asc diam:  2.90 cm MITRAL VALVE MV Area (PHT): 3.68 cm    SHUNTS MV Decel Time: 206 msec    Systemic VTI:  0.16 m MV E velocity: 55.30 cm/s  Systemic Diam: 2.00 cm MV A velocity: 50.60 cm/s MV  E/A ratio:  1.09 Dalton McleanMD Electronically signed by Wilfred Lacy Signature Date/Time: 12/05/2021/4:02:45 PM    Final    CARDIAC CATHETERIZATION  Result Date: 12/04/2021   Prox RCA to Mid RCA lesion is 25% stenosed.   Prox LAD to Mid LAD lesion is 100% stenosed.   1st Diag lesion is 60% stenosed.   A drug-eluting stent was successfully placed using a SYNERGY XD 3.50X20.   Post intervention, there is a 0% residual stenosis.   LV end diastolic pressure is mildly elevated. 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 Slow flow resolved with IC verapamil. Moderate disease in the first diagonal will be treated medically. Continue DAPT for one year. Assess LV function with Echo.    Cardiac Studies   Procedures  Coronary/Graft Acute MI Revascularization  LEFT HEART CATH AND CORONARY ANGIOGRAPHY   Conclusion      Prox RCA to Mid RCA lesion is 25% stenosed.   Prox LAD to Mid LAD lesion is 100% stenosed.   1st Diag lesion is 60% stenosed.   A drug-eluting stent was successfully placed using a SYNERGY XD 3.50X20.   Post intervention, there is a 0% residual stenosis.   LV end diastolic pressure is mildly elevated.   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 Slow flow resolved with IC verapamil. Moderate disease in the first diagonal will be treated medically.    Continue DAPT for one year. Assess LV function with Echo.    Coronary Diagrams  Diagnostic Dominance: Right  Intervention   Implants      Echo 12/05/21: IMPRESSIONS     1. Left ventricular ejection fraction, by  estimation, is 30 to 35%. The  left ventricle has moderately decreased function. The left ventricle  demonstrates regional wall motion abnormalities with mid to apical  anteroseptal and inferoseptal akinesis;  akinesis of the apical inferior, lateral, and anterior walls; and akinesis  of the true apex. This is suggestive of LAD-territory MI. No LV thrombus  noted. There is mild left ventricular hypertrophy. Left ventricular  diastolic parameters are consistent  with Grade I diastolic dysfunction (impaired relaxation).   2. Right ventricular systolic function is normal. The right ventricular  size is normal. Tricuspid regurgitation signal is inadequate for assessing  PA pressure.   3. The mitral valve is normal in structure. Trivial mitral valve  regurgitation. No evidence of mitral stenosis.   4. The aortic valve was not well visualized. Aortic valve regurgitation  is not visualized. No aortic stenosis is present.   5. The inferior vena cava is normal in size with <50% respiratory  variability, suggesting right atrial pressure of 8 mmHg.   Patient Profile     66 y.o. male with history of tobacco abuse presented with acute anterior STEMI  Assessment & Plan    Acute anterior STEMI. S/p emergent stenting of mid LAD across large diagonal. There was some plaque shift into ostial diagonal but maintained TIMI 3 flow and he was asymptomatic. Will treat medically. On DAPT for one year. High dose statin. Troponin >24K. EF 30-35% by Echo.   On beta blocker and ARB. With low BP today will reduce dose.  Elevated blood sugar. A1c 5.5% Hypercholesterolemia. LDL 147.  On high dose statin.  Tobacco abuse. Counseled on smoking cessation.  Acute systolic CHF. EF 30-35%. No evidence  clinically of volume overload. On Toprol XL and low dose losartan. Will add [place]. Optimize medical therapy as BP allows. Plan repeat Echo in 3 months.  Will transfer to telemetry today. Ambulate   For questions or  updates, please contact [facility] Please consult [email or web address] for contact info under        Signed, Annalyce Lanpher [place], MD  12/06/2021, 8:07 AM

## 2021-12-06 NOTE — Progress Notes (Signed)
CARDIAC REHAB PHASE I    Pt resting well in bed. He states he has been walking around the unit without problem today.He hopes to go home tomorrow once his medications are adjusted today. Pt has no questions or concerns regarding plan of care or discharge teaching.  Will continue to follow.   [id number] Woodroe Chen, RN BSN 12/06/2021 12:15 PM

## 2021-12-07 DIAGNOSIS — I255 Ischemic cardiomyopathy: Secondary | ICD-10-CM

## 2021-12-07 DIAGNOSIS — I2102 ST elevation (STEMI) myocardial infarction involving left anterior descending coronary artery: Secondary | ICD-10-CM | POA: Diagnosis not present

## 2021-12-07 DIAGNOSIS — E785 Hyperlipidemia, unspecified: Secondary | ICD-10-CM

## 2021-12-07 LAB — BASIC METABOLIC PANEL
Anion gap: 8 (ref 5–15)
BUN: 14 mg/dL (ref 8–23)
CO2: 24 mmol/L (ref 22–32)
Calcium: 8.9 mg/dL (ref 8.9–10.3)
Chloride: 104 mmol/L (ref 98–111)
Creatinine, Ser: 1.04 mg/dL (ref 0.61–1.24)
GFR, Estimated: >60 mL/min (ref >60)
Glucose, Bld: 97 mg/dL (ref 70–99)
Potassium: 4.1 mmol/L (ref 3.5–5.1)
Sodium: 136 mmol/L (ref 135–145)

## 2021-12-07 MED ORDER — LOSARTAN POTASSIUM 25 MG PO TABS: 12.5000 mg | ORAL_TABLET | Freq: Every day | ORAL | 1 refills | Status: DC

## 2021-12-07 MED ORDER — ASPIRIN 81 MG PO TBEC: 81.0000 mg | DELAYED_RELEASE_TABLET | Freq: Every day | ORAL | 12 refills | Status: AC

## 2021-12-07 MED ORDER — NITROGLYCERIN 0.4 MG SL SUBL: 0.4000 mg | SUBLINGUAL_TABLET | SUBLINGUAL | 2 refills | Status: DC | PRN

## 2021-12-07 MED ORDER — ASPIRIN 81 MG PO TBEC
81.0000 mg | DELAYED_RELEASE_TABLET | Freq: Every day | ORAL | Status: DC
  Administered 2021-12-07: 81 mg via ORAL
  Filled 2021-12-07: qty 1

## 2021-12-07 MED ORDER — METOPROLOL SUCCINATE ER 25 MG PO TB24
12.5000 mg | ORAL_TABLET | Freq: Every day | ORAL | Status: DC
  Administered 2021-12-07: 12.5 mg via ORAL
  Filled 2021-12-07: qty 1

## 2021-12-07 MED ORDER — DAPAGLIFLOZIN PROPANEDIOL 10 MG PO TABS: 10.0000 mg | ORAL_TABLET | Freq: Every day | ORAL | 5 refills | Status: DC

## 2021-12-07 MED ORDER — TICAGRELOR 90 MG PO TABS: 90.0000 mg | ORAL_TABLET | Freq: Two times a day (BID) | ORAL | 11 refills | Status: DC

## 2021-12-07 MED ORDER — ATORVASTATIN CALCIUM 80 MG PO TABS: 80.0000 mg | ORAL_TABLET | Freq: Every day | ORAL | 1 refills | Status: DC

## 2021-12-07 MED ORDER — METOPROLOL SUCCINATE ER 25 MG PO TB24: 12.5000 mg | ORAL_TABLET | Freq: Every day | ORAL | 1 refills | Status: DC

## 2021-12-07 NOTE — Discharge Summary (Addendum)
Discharge Summary    Patient ID: Jeffrey Green MRN: [id number]; DOB: [birth year]  Admit date: 12/04/2021 Discharge date: 12/07/2021  PCP:  Pcp, No   [facility] Providers Cardiologist:  Jeffrey [place], MD        Discharge Diagnoses    Principal Problem:   STEMI involving left anterior descending coronary artery [facility] [address]) Active Problems:   STEMI (ST elevation myocardial infarction) [facility])  Diagnostic Studies/Procedures    Cardiac Catheterization: 11/2021   Prox RCA to Mid RCA lesion is 25% stenosed.   Prox LAD to Mid LAD lesion is 100% stenosed.   1st Diag lesion is 60% stenosed.   A drug-eluting stent was successfully placed using a SYNERGY XD 3.50X20.   Post intervention, there is a 0% residual stenosis.   LV end diastolic pressure is mildly elevated.   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 Slow flow resolved with IC verapamil. Moderate disease in the first diagonal Jeffrey Green be treated medically.    Continue DAPT for one year. Assess LV function with Echo.    Echocardiogram: 11/2021 IMPRESSIONS     1. Left ventricular ejection fraction, by estimation, is 30 to 35%. The  left ventricle has moderately decreased function. The left ventricle  demonstrates regional wall motion abnormalities with mid to apical  anteroseptal and inferoseptal akinesis;  akinesis of the apical inferior, lateral, and anterior walls; and akinesis  of the true apex. This is suggestive of LAD-territory MI. No LV thrombus  noted. There is mild left ventricular hypertrophy. Left ventricular  diastolic parameters are consistent  with Grade I diastolic dysfunction (impaired relaxation).   2. Right ventricular systolic function is normal. The right ventricular  size is normal. Tricuspid regurgitation signal is inadequate for assessing  PA pressure.   3. The mitral valve is normal in structure. Trivial mitral valve   regurgitation. No evidence of mitral stenosis.   4. The aortic valve was not well visualized. Aortic valve regurgitation  is not visualized. No aortic stenosis is present.   5. The inferior vena cava is normal in size with <50% respiratory  variability, suggesting right atrial pressure of 8 mmHg.   History of Present Illness     Jeffrey Green is a 66 y.o. male with past medical history of tobacco use and no prior cardiac history who presented to [facility] on 12/04/2021 as a CODE STEMI.   He reported having chest pain with associated diaphoresis which had acutely started approximately 1 hour prior to arrival. EMS had been called and his initial EKG showed anterior ST elevation, therefore the Cath Lab was activated and he underwent emergent catheterization.  Hospital Course     Consultants: None   Catheterization showed 100% stenosis of the proximal LAD which was treated with PCI/DES placement. He did have residual moderate D1 stenosis with medical management recommended of this. Troponin values peaked at greater than 24,000. He was started on Toprol-XL 25 mg daily and Atorvastatin 80mg  daily.  Follow-up echocardiogram showed a reduced EF of 30 to 35% with numerous wall motion abnormalities noted. Losartan was added to his medication regimen but due to hypotension this was reduced to 12.5 mg daily prior to discharge and Toprol-XL was also reduced to 12.5 mg daily. He was started on Farxiga 10 mg daily but BP did not allow for switching from Losartan to Entresto or adding Spironolactone.  He Loeta Herst need a follow-up echocardiogram in 3 months  for reassessment of his EF. Cardiology follow-up visit has been arranged and a staff message has been sent to the office to arrange for a TOC call.     Did the patient have an acute coronary syndrome (MI, NSTEMI, STEMI, etc) this admission?:  Yes                               AHA/ACC Clinical Performance & Quality Measures: Aspirin prescribed? - Yes ADP  Receptor Inhibitor (Plavix/Clopidogrel, Brilinta/Ticagrelor or Effient/Prasugrel) prescribed (includes medically managed patients)? - Yes Beta Blocker prescribed? - Yes High Intensity Statin (Lipitor 40-80mg  or Crestor 20-40mg ) prescribed? - Yes EF assessed during THIS hospitalization? - Yes For EF <40%, was ACEI/ARB prescribed? - Yes For EF <40%, Aldosterone Antagonist (Spironolactone or Eplerenone) prescribed? - No - Reason:  Hypotension Cardiac Rehab Phase II ordered (including medically managed patients)? - Yes    The patient Jeffrey Green be scheduled for a TOC follow up appointment in 7-14 days.  A message has been sent to the [facility] and Scheduling Pool at the office where the patient should be seen for follow up.  _____________  Discharge Vitals Blood pressure 96/68, pulse 77, temperature 98.5 F (36.9 C), temperature source Oral, resp. rate 17, weight 76.2 kg, SpO2 97 %.  Filed Weights   12/04/21 2007  Weight: 76.2 kg    Labs & Radiologic Studies    CBC Recent Labs    12/04/21 2134 12/05/21 0652  WBC 11.6* 12.5*  NEUTROABS 7.8*  --   HGB 15.8 15.9  HCT 44.7 46.2  MCV 86.6 88.3  PLT 241 192   Basic Metabolic Panel Recent Labs    37/10/62 2134 12/05/21 0652 12/06/21 0716 12/07/21 0120  NA 140   < > 138 136  K 3.5   < > 3.6 4.1  CL 107   < > 104 104  CO2 23   < > 22 24  GLUCOSE 144*   < > 116* 97  BUN 12   < > 12 14  CREATININE 1.07   < > 1.03 1.04  CALCIUM 9.3   < > 8.9 8.9  MG 1.8  --   --   --    < > = values in this interval not displayed.   Liver Function Tests Recent Labs    12/04/21 2134  AST 19  ALT 17  ALKPHOS 68  BILITOT 1.0  PROT 6.9  ALBUMIN 4.5   No results for input(s): "LIPASE", "AMYLASE" in the last 72 hours. High Sensitivity Troponin:   Recent Labs  Lab 12/05/21 0652  TROPONINIHS >24,000*    BNP Invalid input(s): "POCBNP" D-Dimer No results for input(s): "DDIMER" in the last 72 hours. Hemoglobin A1C Recent Labs     12/05/21 0652  HGBA1C 5.5   Fasting Lipid Panel Recent Labs    12/05/21 0652  CHOL 198  HDL 41  LDLCALC 147*  TRIG 49  CHOLHDL 4.8   Thyroid Function Tests Recent Labs    12/05/21 0652  TSH 4.176     Disposition   Pt is being discharged home today in good condition.  Follow-up Plans & Appointments     Follow-up Information     Joylene Grapes, NP Follow up on 12/18/2021.   Specialties: Nurse Practitioner, Family Medicine Why: Cardiology Hospital Follow-up on 12/18/2021 at 2:20 PM with Bernadene Person, NP (works with Dr. [place]) Contact information: [address] [place]  [address] [phone]                Discharge Instructions     Amb Referral to Cardiac Rehabilitation   Complete by: As directed    Diagnosis:  STEMI Coronary Stents     After initial evaluation and assessments completed: Virtual Based Care may be provided alone or in conjunction with Phase 2 Cardiac Rehab based on patient barriers.: Yes   Diet - low sodium heart healthy   Complete by: As directed    Discharge instructions   Complete by: As directed    PLEASE REMEMBER TO BRING ALL OF YOUR MEDICATIONS TO EACH OF YOUR FOLLOW-UP OFFICE VISITS.  PLEASE ATTEND ALL SCHEDULED FOLLOW-UP APPOINTMENTS.   Activity: Increase activity slowly as tolerated. You may shower, but no soaking baths (or swimming) for 1 week. You may not return to work until cleared by your cardiologist. No lifting over 10 lbs for 4 weeks. No sexual activity for 4 weeks.   Wound Care: You may wash cath site gently with soap and water. Keep cath site clean and dry. If you notice pain, swelling, bleeding or pus at your cath site, please call [phone].   Increase activity slowly   Complete by: As directed        Discharge Medications   Allergies as of 12/07/2021   No Known Allergies      Medication List     STOP taking these medications    ondansetron 4 MG disintegrating tablet Commonly known  as: ZOFRAN-ODT   oxyCODONE 5 MG immediate release tablet Commonly known as: Oxy IR/ROXICODONE       TAKE these medications    acetaminophen 500 MG tablet Commonly known as: TYLENOL Take 1,000 mg by mouth daily as needed for mild pain or headache.   aspirin EC 81 MG tablet Take 1 tablet (81 mg total) by mouth daily. Swallow whole.   atorvastatin 80 MG tablet Commonly known as: LIPITOR Take 1 tablet (80 mg total) by mouth daily.   dapagliflozin propanediol 10 MG Tabs tablet Commonly known as: FARXIGA Take 1 tablet (10 mg total) by mouth daily.   losartan 25 MG tablet Commonly known as: COZAAR Take 0.5 tablets (12.5 mg total) by mouth daily.   metoprolol succinate 25 MG 24 hr tablet Commonly known as: TOPROL-XL Take 0.5 tablets (12.5 mg total) by mouth daily.   nitroGLYCERIN 0.4 MG SL tablet Commonly known as: NITROSTAT Place 1 tablet (0.4 mg total) under the tongue every 5 (five) minutes x 3 doses as needed for chest pain.   ticagrelor 90 MG Tabs tablet Commonly known as: BRILINTA Take 1 tablet (90 mg total) by mouth 2 (two) times daily.           Outstanding Labs/Studies   FLP and LFT's in 6-8 weeks; Echocardiogram in 3 months.   Duration of Discharge Encounter   Greater than 30 minutes including physician time.  Signed, Ellsworth Lennox, PA-C 12/07/2021, 9:10 AM   I have seen and examined this patient with [place].  Agree with above, note added to reflect my findings.  Patient admitted for anterior STEMI.  Please review daily note for summary of hospitalization.

## 2021-12-07 NOTE — Progress Notes (Addendum)
Progress Note  Patient Name: Jeffrey Green Date of Encounter: 12/07/2021  [facility] Cardiologist: Peter Swaziland, MD   Subjective   Feels well. No recurrent chest pain or palpitations. No dizziness or presyncope. Anxious to go home.   Inpatient Medications    Scheduled Meds:  aspirin  81 mg Oral Daily   atorvastatin  80 mg Oral Daily   dapagliflozin propanediol  10 mg Oral Daily   enoxaparin (LOVENOX) injection  40 mg Subcutaneous Q24H   losartan  12.5 mg Oral Daily   metoprolol succinate  25 mg Oral Daily   sodium chloride flush  3 mL Intravenous Q12H   ticagrelor  90 mg Oral BID   Continuous Infusions:  sodium chloride     PRN Meds: sodium chloride, acetaminophen, nitroGLYCERIN, ondansetron (ZOFRAN) IV, mouth rinse, sodium chloride flush   Vital Signs    Vitals:   12/06/21 1300 12/06/21 1528 12/06/21 2100 12/07/21 0542  BP: 111/73 100/63 91/63 96/68   Pulse: 86 80 84 77  Resp: 16 16 16 17   Temp: 98.2 F (36.8 C) 98.8 F (37.1 C) 98.9 F (37.2 C) 98.5 F (36.9 C)  TempSrc: Oral Oral Oral Oral  SpO2: 100% 100% 99% 97%  Weight:        Intake/Output Summary (Last 24 hours) at 12/07/2021 0737 Last data filed at 12/06/2021 1849 Gross per 24 hour  Intake 370 ml  Output --  Net 370 ml      12/04/2021    8:07 PM 08/24/2021    4:24 AM 08/18/2021   11:10 PM  Last 3 Weights  Weight (lbs) 168 lb 170 lb 170 lb  Weight (kg) 76.204 kg 77.111 kg 77.111 kg      Telemetry    NSR, HR in 70's to 80's.  - Personally Reviewed  ECG    NSR, HR 73 with persistent ST elevation along the anterior leads.  - Personally Reviewed  Physical Exam   GEN: Pleasant male appearing in no acute distress.   Neck: No JVD Cardiac: RRR, no murmurs, rubs, or gallops.  Respiratory: Clear to auscultation bilaterally without wheezing or rales. GI: Soft, nontender, non-distended  MS: No pitting edema; No deformity. Radial site stable without evidence of a hematoma.  Neuro:  Nonfocal   Psych: Normal affect   Labs    High Sensitivity Troponin:   Recent Labs  Lab 12/05/21 0652  TROPONINIHS >24,000*     Chemistry Recent Labs  Lab 12/04/21 2134 12/05/21 0652 12/06/21 0716 12/07/21 0120  NA 140 139 138 136  K 3.5 3.7 3.6 4.1  CL 107 105 104 104  CO2 23 25 22 24   GLUCOSE 144* 138* 116* 97  BUN 12 10 12 14   CREATININE 1.07 1.00 1.03 1.04  CALCIUM 9.3 9.4 8.9 8.9  MG 1.8  --   --   --   PROT 6.9  --   --   --   ALBUMIN 4.5  --   --   --   AST 19  --   --   --   ALT 17  --   --   --   ALKPHOS 68  --   --   --   BILITOT 1.0  --   --   --   GFRNONAA >60 >60 >60 >60  ANIONGAP 10 9 12 8     Lipids  Recent Labs  Lab 12/05/21 0652  CHOL 198  TRIG 49  HDL 41  LDLCALC 147*  CHOLHDL 4.8  Hematology Recent Labs  Lab 12/04/21 2017 12/04/21 2134 12/05/21 0652  WBC  --  11.6* 12.5*  RBC  --  5.16 5.23  HGB 15.6 15.8 15.9  HCT 46.0 44.7 46.2  MCV  --  86.6 88.3  MCH  --  30.6 30.4  MCHC  --  35.3 34.4  RDW  --  13.0 13.1  PLT  --  241 192   Thyroid  Recent Labs  Lab 12/05/21 0652  TSH 4.176    BNP Recent Labs  Lab 12/04/21 2134  BNP 51.4    DDimer No results for input(s): "DDIMER" in the last 168 hours.    Cardiac Studies   Cardiac Catheterization: 11/2021   Prox RCA to Mid RCA lesion is 25% stenosed.   Prox LAD to Mid LAD lesion is 100% stenosed.   1st Diag lesion is 60% stenosed.   A drug-eluting stent was successfully placed using a SYNERGY XD 3.50X20.   Post intervention, there is a 0% residual stenosis.   LV end diastolic pressure is mildly elevated.   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 Slow flow resolved with IC verapamil. Moderate disease in the first diagonal Lichelle Viets be treated medically.    Continue DAPT for one year. Assess LV function with Echo.   Echocardiogram: 11/2021 IMPRESSIONS     1. Left ventricular ejection fraction, by  estimation, is 30 to 35%. The  left ventricle has moderately decreased function. The left ventricle  demonstrates regional wall motion abnormalities with mid to apical  anteroseptal and inferoseptal akinesis;  akinesis of the apical inferior, lateral, and anterior walls; and akinesis  of the true apex. This is suggestive of LAD-territory MI. No LV thrombus  noted. There is mild left ventricular hypertrophy. Left ventricular  diastolic parameters are consistent  with Grade I diastolic dysfunction (impaired relaxation).   2. Right ventricular systolic function is normal. The right ventricular  size is normal. Tricuspid regurgitation signal is inadequate for assessing  PA pressure.   3. The mitral valve is normal in structure. Trivial mitral valve  regurgitation. No evidence of mitral stenosis.   4. The aortic valve was not well visualized. Aortic valve regurgitation  is not visualized. No aortic stenosis is present.   5. The inferior vena cava is normal in size with <50% respiratory  variability, suggesting right atrial pressure of 8 mmHg.   Patient Profile     66 y.o. male w/ PMH of tobacco use and no prior cardiac history who presented to [facility] on 12/04/2021 as a CODE STEMI.   Assessment & Plan    1. Anterior STEMI - Cath on admission showed 100% stenosis of the proximal to mid-LAD treated with PCI/DES placement. He did have residual moderate D1 stenosis with medical management recommended.  - No recurrent anginal symptoms.  - Continue DAPT with ASA and Brilinta along with Toprol-XL 25mg  daily and Atorvastatin 80mg  daily.   2. HFrEF - Echo this admission shows a reduced EF of 30-35% with regional wall motion abnormalities as outlined above. He has been started on low-dose Losartan and low-dose Toprol-XL with doses being reduced given his hypotension. BP does not allow for switching Losartan to Entresto or adding Spironolactone at this time. Continue Farxiga 10mg  daily. He Cina Klumpp need  a repeat limited echocardiogram in 3 months for reassessment of his EF.   3. HTN - BP was soft at 86/67 yesterday  morning, improved since. At 96/68 on most recent check. Losartan has been reduced to 12.5mg  daily. Alfredia Desanctis also reduce Toprol-XL from 25mg  daily to 12.5mg  daily.   4. HLD - FLP this admission shows total cholesterol of 198, HDL 41, triglycerides 49 and LDL 147. He has been started on Atorvastatin 80mg  daily and Herb Beltre need repeat FLP and LFT's in 6-8 weeks.    Anticipate he should be stable for discharge today with close outpatient follow-up. Did encourage the patient and his wife to follow BP at home.    For questions or updates, please contact [facility] Please consult [email or web address] for contact info under     Signed, , PA-C  12/07/2021, 7:37 AM    I have seen and examined this patient with Ellsworth Lennox.  Agree with above, note added to reflect my findings.  Currently feeling well without complaint.  No further chest pain or shortness of breath.  Able to do all of his daily activities without complaint at this time.  GEN: Well nourished, well developed, in no acute distress  HEENT: normal  Neck: no JVD, carotid bruits, or masses Cardiac: RRR; no murmurs, rubs, or gallops,no edema  Respiratory:  clear to auscultation bilaterally, normal work of breathing GI: soft, nontender, nondistended, + BS MS: no deformity or atrophy  Skin: warm and dry Neuro:  Strength and sensation are intact Psych: euthymic mood, full affect   Anterior STEMI: 100% stenosis of the proximal mid LAD treated with drug-eluting stent.  Currently on aspirin and Brilinta. Heart failure with reduced ejection fraction: Currently on losartan and Toprol-XL.  Doses have had to be reduced due to low blood pressures.  Also on Farxiga 10 mg daily.  Holding off on other heart failure medications.  We Holleigh Crihfield reassess ejection fraction in 3 months. Hypertension: Blood pressure has been low since  his MI.  On low-dose medications for heart failure. Hyperlipidemia: Continue atorvastatin 80 mg daily.  Goal LDL less than 70.  Plan for discharge today.  We Laloni Rowton arrange for follow-up in cardiology clinic.  Dovey Fatzinger M. Nakota Elsen MD 12/07/2021 8:27 AM

## 2021-12-09 ENCOUNTER — Telehealth: Payer: Self-pay | Admitting: Nurse Practitioner

## 2021-12-09 NOTE — Telephone Encounter (Signed)
-----   Message from Ellsworth Lennox, [place] sent at 12/07/2021  8:49 AM EDT ----- Regarding: TOC Call Good morning,   This patient is being discharged today and I arranged for him to have a follow-up visit on 12/18/2021. He will need a TOC Call.   Thanks,  [place]

## 2021-12-09 NOTE — Telephone Encounter (Signed)
-  TOC-first attempt -Left message to call back.

## 2021-12-10 NOTE — Telephone Encounter (Signed)
Patient returned call

## 2021-12-11 NOTE — Telephone Encounter (Signed)
Left message for pt to call.

## 2021-12-13 NOTE — Telephone Encounter (Signed)
LMTCB

## 2021-12-18 ENCOUNTER — Ambulatory Visit: Payer: BC Managed Care – PPO | Admitting: Nurse Practitioner

## 2021-12-18 ENCOUNTER — Encounter: Payer: Self-pay | Admitting: Nurse Practitioner

## 2021-12-18 VITALS — BP 108/68 | HR 87 | Ht 70.0 in | Wt 162.0 lb

## 2021-12-18 DIAGNOSIS — I2510 Atherosclerotic heart disease of native coronary artery without angina pectoris: Secondary | ICD-10-CM | POA: Diagnosis not present

## 2021-12-18 DIAGNOSIS — E785 Hyperlipidemia, unspecified: Secondary | ICD-10-CM

## 2021-12-18 DIAGNOSIS — I255 Ischemic cardiomyopathy: Secondary | ICD-10-CM | POA: Diagnosis not present

## 2021-12-18 DIAGNOSIS — Z720 Tobacco use: Secondary | ICD-10-CM | POA: Diagnosis not present

## 2021-12-18 NOTE — Progress Notes (Addendum)
Office Visit    Patient Name: Jeffrey Green Date of Encounter: 12/18/2021  Primary Care Provider:  Pcp, No Primary Cardiologist:  Peter Swaziland, MD  Chief Complaint    66 year old male with a history of CAD s/p STEMI, DES-p-mLAD in 11/2021, ICM, hyperlipidemia, tobacco use, and cholecystitis s/p cholecystectomy in April 2023 who presents for posthospital follow-up related to CAD.  Past Medical History    Past Medical History:  Diagnosis Date   Medical history non-contributory    Past Surgical History:  Procedure Laterality Date   CHOLECYSTECTOMY N/A 08/24/2021   Procedure: LAPAROSCOPIC CHOLECYSTECTOMY;  Surgeon: Fritzi Mandes, MD;  Location: [facility] OR;  Service: General;  Laterality: N/A;   CORONARY/GRAFT ACUTE MI REVASCULARIZATION N/A 12/04/2021   Procedure: Coronary/Graft Acute MI Revascularization;  Surgeon: Swaziland, Peter M, MD;  Location: [facility] INVASIVE CV LAB;  Service: Cardiovascular;  Laterality: N/A;   LEFT HEART CATH AND CORONARY ANGIOGRAPHY N/A 12/04/2021   Procedure: LEFT HEART CATH AND CORONARY ANGIOGRAPHY;  Surgeon: Swaziland, Peter M, MD;  Location: [facility] INVASIVE CV LAB;  Service: Cardiovascular;  Laterality: N/A;    Allergies  No Known Allergies  History of Present Illness    66 year old male with a history of CAD s/p STEMI, DES-p-mLAD in 11/2021, ICM, hyperlipidemia, tobacco use, and cholecystitis s/p cholecystectomy in April 2023.  He was transported to [facility] via [facility] for acute onset chest pain with associated diaphoresis.  EKG obtained by EMS was concerning for ST elevation in V1 through V4.  He underwent emergent cardiac catheterization which revealed 100% p-mLAD stenosis s/p DES (culprit lesion), 25% p-mRCA, 60% D1 stenoses.  He was started on aspirin, Brilinta, Lipitor, metoprolol, and losartan.  He was hospitalized from 12/04/2021 to 12/07/2021 in the setting of STEMI.  Echocardiogram showed EF 30 to 35%, moderately decreased LV function, RWMA with mid to apical  anteroseptal and inferoseptal akinesis, akinesis of the apical inferior, lateral, and anterior walls, akinesis of the true apex consistent with LAD territory MI, G1 DD, mild RV systolic function, no significant valvular abnormalities.  Started on Farxiga.  Further escalation of GDMT was limited in the setting of hypotension.  Follow-up echocardiogram was recommended in 3 months.  He was discharged home in stable condition on 12/07/2021.  He presents today for follow-up accompanied by his wife. Since his hospitalization he has done well from a cardiac standpoint.  He denies any dyspnea, denies symptoms concerning for angina.  He does report some overall generalized fatigue, but otherwise, he reports feeling well.  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  He is tolerating his medications though he has noted some lower BP readings at home with SBP readings in the 90s to low 100s.  He wonders if this could be contributing to his fatigue.  Otherwise, he reports feeling well denies any additional concerns today.  Home Medications    Current Outpatient Medications  Medication Sig Dispense Refill   aspirin EC 81 MG tablet Take 1 tablet (81 mg total) by mouth daily. Swallow whole. 30 tablet 12   atorvastatin (LIPITOR) 80 MG tablet Take 1 tablet (80 mg total) by mouth daily. 90 tablet 1   dapagliflozin propanediol (FARXIGA) 10 MG TABS tablet Take 1 tablet (10 mg total) by mouth daily. 30 tablet 5   losartan (COZAAR) 25 MG tablet Take 0.5 tablets (12.5 mg total) by mouth daily. 45 tablet 1   metoprolol succinate (TOPROL-XL) 25 MG 24 hr tablet Take 0.5 tablets (12.5  mg total) by mouth daily. 45 tablet 1   nitroGLYCERIN (NITROSTAT) 0.4 MG SL tablet Place 1 tablet (0.4 mg total) under the tongue every 5 (five) minutes x 3 doses as needed for chest pain. 25 tablet 2   ticagrelor (BRILINTA) 90 MG TABS tablet Take 1 tablet (90 mg total) by mouth 2 (two) times daily. 60 tablet 11   acetaminophen  (TYLENOL) 500 MG tablet Take 1,000 mg by mouth daily as needed for mild pain or headache. (Patient not taking: Reported on 12/18/2021)     No current facility-administered medications for this visit.     Review of Systems    He denies chest pain, palpitations, dyspnea, pnd, orthopnea, n, v, dizziness, syncope, edema, weight gain, or early satiety. All other systems reviewed and are otherwise negative except as noted above.     Cardiac Rehabilitation Eligibility Assessment  The patient is ready to start cardiac rehabilitation from a cardiac standpoint.    Physical Exam    VS:  BP 108/68   Pulse 87   Ht 5\' 10"  (1.778 m)   Wt 162 lb (73.5 kg)   SpO2 98%   BMI 23.24 kg/m  GEN: Well nourished, well developed, in no acute distress. HEENT: normal. Neck: Supple, no JVD, carotid bruits, or masses. Cardiac: RRR, no murmurs, rubs, or gallops. No clubbing, cyanosis, edema.  Radials/DP/PT 2+ and equal bilaterally.  Right radial cath site without bruising, bleeding, or hematoma. Respiratory:  Respirations regular and unlabored, clear to auscultation bilaterally. GI: Soft, nontender, nondistended, BS + x 4. MS: no deformity or atrophy. Skin: warm and dry, no rash. Neuro:  Strength and sensation are intact. Psych: Normal affect.  Accessory Clinical Findings    ECG personally reviewed by me today - NSR, 87 bpm , ST/T wave changes- no acute changes.  Lab Results  Component Value Date   WBC 12.5 (H) 12/05/2021   HGB 15.9 12/05/2021   HCT 46.2 12/05/2021   MCV 88.3 12/05/2021   PLT 192 12/05/2021   Lab Results  Component Value Date   CREATININE 1.04 12/07/2021   BUN 14 12/07/2021   NA 136 12/07/2021   K 4.1 12/07/2021   CL 104 12/07/2021   CO2 24 12/07/2021   Lab Results  Component Value Date   ALT 17 12/04/2021   AST 19 12/04/2021   ALKPHOS 68 12/04/2021   BILITOT 1.0 12/04/2021   Lab Results  Component Value Date   CHOL 198 12/05/2021   HDL 41 12/05/2021   LDLCALC 147  (H) 12/05/2021   TRIG 49 12/05/2021   CHOLHDL 4.8 12/05/2021    Lab Results  Component Value Date   HGBA1C 5.5 12/05/2021    Assessment & Plan    1. CAD: S/p DES p-mLAD. He feels generally tired. Denies symptoms concerning for angina.  He has noted some low BP readings with SBP in the 90s to low 100s.  BP better in office today.  I advised him to continue to monitor his BP and report SBP consistently <100.  Given ongoing fatigue, if low BP persists, may need to consider de-escalation of medical therapy.  For now, continue aspirin, Brilinta, losartan, metoprolol, Farxiga, and Lipitor.  Will check BMET today given new losartan, Farxiga.  Discussed cardiac rehab, patient may begin cardiac rehab if he is interested.  2. ICM: Echo showed EF 30 to 35%, moderately decreased LV function, RWMA with mid to apical anteroseptal and inferoseptal akinesis, akinesis of the apical inferior, lateral, and anterior walls, akinesis  of the true apex consistent with LAD territory MI, G1 DD, mild RV systolic function, no significant valvular abnormalities. Euvolemic and well compensated on exam. Discussed daily weights.  Other escalation of GDMT limited in the setting of hypotension.  Continue current medications as above.  Will plan for repeat echo in 3 months.  3. Hyperlipidemia: LDL was 127 in 11/2021. Repeat fasting lipids, LFTs in 6 weeks.   4. Tobacco use: He has quit smoking.  I congratulated him on this.   5. Disposition: Follow-up in 6 weeks.   Joylene Grapes, NP 12/18/2021, 5:31 PM

## 2021-12-18 NOTE — Patient Instructions (Signed)
Medication Instructions:  Your physician recommends that you continue on your current medications as directed. Please refer to the Current Medication list given to you today.   *If you need a refill on your cardiac medications before your next appointment, please call your pharmacy*   Lab Work: Your physician recommends that you complete labs today BMET   If you have labs (blood work) drawn today and your tests are completely normal, you will receive your results only by: MyChart Message (if you have MyChart) OR A paper copy in the mail If you have any lab test that is abnormal or we need to change your treatment, we will call you to review the results.   Testing/Procedures: NONE ordered at this time of appointment      Follow-Up: At [facility], you and your health needs are our priority.  As part of our continuing mission to provide you with exceptional heart care, we have created designated Provider Care Teams.  These Care Teams include your primary Cardiologist (physician) and Advanced Practice Providers (APPs -  Physician Assistants and Nurse Practitioners) who all work together to provide you with the care you need, when you need it.  We recommend signing up for the patient portal called "MyChart".  Sign up information is provided on this After Visit Summary.  MyChart is used to connect with patients for Virtual Visits (Telemedicine).  Patients are able to view lab/test results, encounter notes, upcoming appointments, etc.  Non-urgent messages can be sent to your provider as well.   To learn more about what you can do with MyChart, go to [email or web address].    Your next appointment:   6 week(s)  The format for your next appointment:   In Person  Provider:   Bernadene Person, NP        Other Instructions   Important Information About Sugar

## 2021-12-19 ENCOUNTER — Telehealth (HOSPITAL_COMMUNITY): Payer: Self-pay

## 2021-12-19 LAB — BASIC METABOLIC PANEL
BUN/Creatinine Ratio: 12 (ref 10–24)
BUN: 13 mg/dL (ref 8–27)
CO2: 23 mmol/L (ref 20–29)
Calcium: 9.7 mg/dL (ref 8.6–10.2)
Chloride: 103 mmol/L (ref 96–106)
Creatinine, Ser: 1.06 mg/dL (ref 0.76–1.27)
Glucose: 86 mg/dL (ref 70–99)
Potassium: 4.3 mmol/L (ref 3.5–5.2)
Sodium: 144 mmol/L (ref 134–144)
eGFR: 78 mL/min/{1.73_m2} (ref >59)

## 2021-12-19 NOTE — Telephone Encounter (Signed)
Pt is not interested in the cardiac rehab program. Closed referral 

## 2021-12-25 ENCOUNTER — Telehealth: Payer: Self-pay

## 2021-12-25 NOTE — Telephone Encounter (Signed)
Lmom to discuss lab results. Results were viewed on mychart by pt. Pt advised to call back with any questions or concerns.

## 2022-01-06 ENCOUNTER — Telehealth: Payer: Self-pay | Admitting: Cardiology

## 2022-01-06 NOTE — Telephone Encounter (Signed)
  Per MyChart scheduling message:   Dr. Bernadene Person or Dr. Swaziland:  Hi. 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  He is complaining of little to no energy at all and feeling very tired all the time.  He thinks his blood pressure may be too low.  These are the last 4 BP taken: Friday 94/42 pulse: 68 Saturday 99/61 p:82 Sunday 93/54 p: 74 Today 91/52 p: 63 Are these lower than you would expect?  Is there any way to get his BP up more? Thank you for responding. [facility] [employer]

## 2022-01-06 NOTE — Telephone Encounter (Signed)
Returned call to patient who c/o intermittent low BP.  He states BP is intermittently low since Friday.   Reports no significant change.   Reports eating and drinking well.  He was out in the heat some this weekend but no more than usual.    Asymptomatic currently.   He reports he can tell when BP is low as he feels very fatigued and has no energy.   Denies dizziness or lightheadedness.     Current medications: Toprol 12.5, farxiga 10 mg, losartan 12.5 mg daily    Advised to increase hydration and continue to monitor BP.  Will send message to [facility] NP to review.

## 2022-01-07 NOTE — Telephone Encounter (Signed)
Lmom, waiting on a return call.  

## 2022-01-08 NOTE — Telephone Encounter (Signed)
Pt returned call to discuss lab results. Pt voiced understanding and will continue his current medications.

## 2022-01-08 NOTE — Telephone Encounter (Signed)
Spoke with pt. Pt is going to hold Farxiga 10 mg as directed to see if this helps his symptoms. Pt will call back with an update in 1-2 weeks.

## 2022-01-29 NOTE — Progress Notes (Unsigned)
Office Visit    Patient Name: Jeffrey Green Date of Encounter: 01/30/2022  Primary Care Provider:  Pcp, No Primary Cardiologist:  Peter Swaziland, MD  Chief Complaint    66 year old male with a history of CAD s/p STEMI, DES-p-mLAD in 11/2021, ICM, hyperlipidemia, tobacco use, and cholecystitis s/p cholecystectomy in April 2023 who presents for follow-up related to CAD.   Past Medical History    Past Medical History:  Diagnosis Date   Medical history non-contributory    Past Surgical History:  Procedure Laterality Date   CHOLECYSTECTOMY N/A 08/24/2021   Procedure: LAPAROSCOPIC CHOLECYSTECTOMY;  Surgeon: Fritzi Mandes, MD;  Location: [facility] OR;  Service: General;  Laterality: N/A;   CORONARY/GRAFT ACUTE MI REVASCULARIZATION N/A 12/04/2021   Procedure: Coronary/Graft Acute MI Revascularization;  Surgeon: Swaziland, Peter M, MD;  Location: [facility] INVASIVE CV LAB;  Service: Cardiovascular;  Laterality: N/A;   LEFT HEART CATH AND CORONARY ANGIOGRAPHY N/A 12/04/2021   Procedure: LEFT HEART CATH AND CORONARY ANGIOGRAPHY;  Surgeon: Swaziland, Peter M, MD;  Location: [facility] INVASIVE CV LAB;  Service: Cardiovascular;  Laterality: N/A;    Allergies  No Known Allergies  History of Present Illness    66 year old male with a history of CAD s/p STEMI, DES-p-mLAD in 11/2021, ICM, hyperlipidemia, tobacco use, and cholecystitis s/p cholecystectomy in April 2023.   He was hospitalized in July 2023 in the setting of STEMI. He underwent emergent cardiac catheterization which revealed 100% p-mLAD stenosis s/p DES (culprit lesion), 25% p-mRCA, 60% D1 stenoses.  He was started on aspirin, Brilinta, Lipitor, metoprolol, and losartan.  Echocardiogram showed EF 30 to 35%, moderately decreased LV function, RWMA with mid to apical anteroseptal and inferoseptal akinesis, akinesis of the apical inferior, lateral, and anterior walls, akinesis of the true apex consistent with LAD territory MI, G1 DD, mild RV systolic function, no  significant valvular abnormalities.  He was started on Farxiga.  Further escalation of GDMT was limited in the setting of hypotension. Follow-up echocardiogram was recommended in 3 months.    He was last seen in the office on 12/18/2021 and was stable from a cardiac standpoint.  He noted some generalized fatigue, otherwise, he denies symptoms concerning for angina.  He did note some intermittent hypotension.  [name] was later held due to ongoing hypotension.  He presents today for follow-up accompanied by his wife. Since his last visit he has done well from a cardiac standpoint.  He had 1 episode a the other week he was working out in the heat where he felt fatigued and began to sweat.  He took a nitroglycerin which made him feel worse.  He denies any chest pain, dyspnea, dizziness, presyncope, syncope.  He thinks he may have been dehydrated at the time.  His BP has been better (SBP in the 110s generally) and his energy has improved significantly with the discontinuation of Farxiga. Overall, he reports feeling well denies any additional concerns today.  Home Medications    Current Outpatient Medications  Medication Sig Dispense Refill   aspirin EC 81 MG tablet Take 1 tablet (81 mg total) by mouth daily. Swallow whole. 30 tablet 12   atorvastatin (LIPITOR) 80 MG tablet Take 1 tablet (80 mg total) by mouth daily. 90 tablet 1   losartan (COZAAR) 25 MG tablet Take 0.5 tablets (12.5 mg total) by mouth daily. 45 tablet 1   metoprolol succinate (TOPROL-XL) 25 MG 24 hr tablet Take 0.5 tablets (12.5 mg total) by mouth daily. 45 tablet 1   nitroGLYCERIN (  NITROSTAT) 0.4 MG SL tablet Place 1 tablet (0.4 mg total) under the tongue every 5 (five) minutes x 3 doses as needed for chest pain. 25 tablet 2   ticagrelor (BRILINTA) 90 MG TABS tablet Take 1 tablet (90 mg total) by mouth 2 (two) times daily. 60 tablet 11   No current facility-administered medications for this visit.    Review of Systems    He denies  chest pain, palpitations, dyspnea, pnd, orthopnea, n, v, dizziness, syncope, edema, weight gain, or early satiety. All other systems reviewed and are otherwise negative except as noted above.   Physical Exam    VS:  BP 136/82   Pulse 78   Ht 5\' 10"  (1.778 m)   Wt 164 lb 12.8 oz (74.8 kg)   SpO2 99%   BMI 23.65 kg/m   GEN: Well nourished, well developed, in no acute distress. HEENT: normal. Neck: Supple, no JVD, carotid bruits, or masses. Cardiac: RRR, no murmurs, rubs, or gallops. No clubbing, cyanosis, edema.  Radials/DP/PT 2+ and equal bilaterally.  Respiratory:  Respirations regular and unlabored, clear to auscultation bilaterally. GI: Soft, nontender, nondistended, BS + x 4. MS: no deformity or atrophy. Skin: warm and dry, no rash. Neuro:  Strength and sensation are intact. Psych: Normal affect.  Accessory Clinical Findings    ECG personally reviewed by me today - No EKG in office today.    Lab Results  Component Value Date   WBC 12.5 (H) 12/05/2021   HGB 15.9 12/05/2021   HCT 46.2 12/05/2021   MCV 88.3 12/05/2021   PLT 192 12/05/2021   Lab Results  Component Value Date   CREATININE 1.06 12/18/2021   BUN 13 12/18/2021   NA 144 12/18/2021   K 4.3 12/18/2021   CL 103 12/18/2021   CO2 23 12/18/2021   Lab Results  Component Value Date   ALT 17 12/04/2021   AST 19 12/04/2021   ALKPHOS 68 12/04/2021   BILITOT 1.0 12/04/2021   Lab Results  Component Value Date   CHOL 198 12/05/2021   HDL 41 12/05/2021   LDLCALC 147 (H) 12/05/2021   TRIG 49 12/05/2021   CHOLHDL 4.8 12/05/2021    Lab Results  Component Value Date   HGBA1C 5.5 12/05/2021    Assessment & Plan    1. CAD: S/p DES p-mLAD. Stable with no anginal symptoms. Continue aspirin, Brilinta, losartan, metoprolol, and Lipitor.    2. ICM: Echo showed EF 30 to 35%, moderately decreased LV function, RWMA with mid to apical anteroseptal and inferoseptal akinesis, akinesis of the apical inferior, lateral, and  anterior walls, akinesis of the true apex consistent with LAD territory MI, G1 DD, mild RV systolic function, no significant valvular abnormalities. Euvolemic and well compensated on exam. Further escalation of GDMT limited in the setting of hypotension.  Continue current medications as above.  Will plan for repeat echo in November 2023.    3. Hyperlipidemia: LDL was 127 in 11/2021. Will repeat lipids, LFTs today.  Continue aspirin, Lipitor.   4. Former tobacco use: He has not smoked since his hospital discharge.  I congratulated him on this.    5. Disposition: Follow-up in 3-4 months.      Lenna Sciara, NP 01/30/2022, 8:50 AM

## 2022-01-30 ENCOUNTER — Encounter: Payer: Self-pay | Admitting: Nurse Practitioner

## 2022-01-30 ENCOUNTER — Ambulatory Visit: Payer: BC Managed Care – PPO | Attending: Nurse Practitioner | Admitting: Nurse Practitioner

## 2022-01-30 VITALS — BP 136/82 | HR 78 | Ht 70.0 in | Wt 164.8 lb

## 2022-01-30 DIAGNOSIS — E785 Hyperlipidemia, unspecified: Secondary | ICD-10-CM | POA: Diagnosis not present

## 2022-01-30 DIAGNOSIS — I255 Ischemic cardiomyopathy: Secondary | ICD-10-CM

## 2022-01-30 DIAGNOSIS — I2510 Atherosclerotic heart disease of native coronary artery without angina pectoris: Secondary | ICD-10-CM | POA: Diagnosis not present

## 2022-01-30 DIAGNOSIS — Z720 Tobacco use: Secondary | ICD-10-CM | POA: Diagnosis not present

## 2022-01-30 LAB — LIPID PANEL
Chol/HDL Ratio: 3.1 ratio (ref 0.0–5.0)
Cholesterol, Total: 130 mg/dL (ref 100–199)
HDL: 42 mg/dL (ref >39)
LDL Chol Calc (NIH): 71 mg/dL (ref 0–99)
Triglycerides: 91 mg/dL (ref 0–149)
VLDL Cholesterol Cal: 17 mg/dL (ref 5–40)

## 2022-01-30 LAB — HEPATIC FUNCTION PANEL
ALT: 28 IU/L (ref 0–44)
AST: 18 IU/L (ref 0–40)
Albumin: 5.1 g/dL — ABNORMAL HIGH (ref 3.9–4.9)
Alkaline Phosphatase: 95 IU/L (ref 44–121)
Bilirubin Total: 1.1 mg/dL (ref 0.0–1.2)
Bilirubin, Direct: 0.26 mg/dL (ref 0.00–0.40)
Total Protein: 7.1 g/dL (ref 6.0–8.5)

## 2022-01-30 NOTE — Patient Instructions (Signed)
Medication Instructions:  Your physician recommends that you continue on your current medications as directed. Please refer to the Current Medication list given to you today.   *If you need a refill on your cardiac medications before your next appointment, please call your pharmacy*   Lab Work: Your physician recommends that you complete labs today. Lipid LFTs  If you have labs (blood work) drawn today and your tests are completely normal, you will receive your results only by: [place] (if you have MyChart) OR A paper copy in the mail If you have any lab test that is abnormal or we need to change your treatment, we will call you to review the results.   Testing/Procedures: Your physician has requested that you have an echocardiogram November 2023. Echocardiography is a painless test that uses sound waves to create images of your heart. It provides your doctor with information about the size and shape of your heart and how well your heart's chambers and valves are working. This procedure takes approximately one hour. There are no restrictions for this procedure.    Follow-Up: At [facility], you and your health needs are our priority.  As part of our continuing mission to provide you with exceptional heart care, we have created designated Provider Care Teams.  These Care Teams include your primary Cardiologist (physician) and Advanced Practice Providers (APPs -  Physician Assistants and Nurse Practitioners) who all work together to provide you with the care you need, when you need it.  We recommend signing up for the patient portal called "MyChart".  Sign up information is provided on this After Visit Summary.  MyChart is used to connect with patients for Virtual Visits (Telemedicine).  Patients are able to view lab/test results, encounter notes, upcoming appointments, etc.  Non-urgent messages can be sent to your provider as well.   To learn more about what you can do with  MyChart, go to [email or web address].    Your next appointment:   3-4 month(s)  The format for your next appointment:   In Person  Provider:   Peter Martinique, MD  or Diona Browner, NP        Other Instructions Monitor Blood pressure. Goal 130/80 or less  Important Information About Sugar

## 2022-02-07 ENCOUNTER — Telehealth: Payer: Self-pay

## 2022-02-07 DIAGNOSIS — Z79899 Other long term (current) drug therapy: Secondary | ICD-10-CM

## 2022-02-07 DIAGNOSIS — E785 Hyperlipidemia, unspecified: Secondary | ICD-10-CM

## 2022-02-07 NOTE — Telephone Encounter (Signed)
Lmom, to discuss lab results and recommendations. Waiting on a return call.  

## 2022-02-12 ENCOUNTER — Other Ambulatory Visit: Payer: Self-pay

## 2022-02-12 DIAGNOSIS — E785 Hyperlipidemia, unspecified: Secondary | ICD-10-CM

## 2022-02-12 DIAGNOSIS — Z79899 Other long term (current) drug therapy: Secondary | ICD-10-CM

## 2022-02-12 NOTE — Addendum Note (Signed)
Addended by: Derrick Ravel on: 56/07/8935 34:28 PM   Modules accepted: Orders

## 2022-02-12 NOTE — Telephone Encounter (Signed)
Lmom to discuss lab results. Waiting on a return call.  

## 2022-02-12 NOTE — Telephone Encounter (Signed)
Pt returned call and was notified of lab results and recommendations. Pt doesn't want to start Zetia and states he'll discuss it further at his 04/2022 apt. Pt will repeat labs as directed.

## 2022-02-28 ENCOUNTER — Other Ambulatory Visit: Payer: Self-pay | Admitting: Student

## 2022-03-18 ENCOUNTER — Ambulatory Visit (HOSPITAL_COMMUNITY): Payer: BC Managed Care – PPO | Attending: Cardiology

## 2022-03-18 DIAGNOSIS — I255 Ischemic cardiomyopathy: Secondary | ICD-10-CM | POA: Diagnosis not present

## 2022-03-18 DIAGNOSIS — I2510 Atherosclerotic heart disease of native coronary artery without angina pectoris: Secondary | ICD-10-CM | POA: Diagnosis not present

## 2022-03-18 LAB — ECHOCARDIOGRAM COMPLETE
Area-P 1/2: 3.16 cm2
S' Lateral: 2.6 cm

## 2022-03-26 ENCOUNTER — Telehealth: Payer: Self-pay

## 2022-03-26 NOTE — Telephone Encounter (Signed)
Lmom, waiting on a return call to discuss echo results.  

## 2022-03-26 NOTE — Telephone Encounter (Signed)
Patient returned your call.

## 2022-03-26 NOTE — Telephone Encounter (Signed)
Pt returned call to discuss results. Pt was notified of echo results and recommendations.

## 2022-04-24 DIAGNOSIS — E785 Hyperlipidemia, unspecified: Secondary | ICD-10-CM | POA: Diagnosis not present

## 2022-04-24 DIAGNOSIS — Z79899 Other long term (current) drug therapy: Secondary | ICD-10-CM | POA: Diagnosis not present

## 2022-04-24 LAB — LIPID PANEL
Chol/HDL Ratio: 2.3 ratio (ref 0.0–5.0)
Cholesterol, Total: 124 mg/dL (ref 100–199)
HDL: 53 mg/dL (ref >39)
LDL Chol Calc (NIH): 50 mg/dL (ref 0–99)
Triglycerides: 114 mg/dL (ref 0–149)
VLDL Cholesterol Cal: 21 mg/dL (ref 5–40)

## 2022-05-01 ENCOUNTER — Encounter: Payer: Self-pay | Admitting: Nurse Practitioner

## 2022-05-01 ENCOUNTER — Ambulatory Visit: Payer: BC Managed Care – PPO | Attending: Nurse Practitioner | Admitting: Nurse Practitioner

## 2022-05-01 VITALS — BP 138/84 | HR 89 | Ht 70.0 in | Wt 172.4 lb

## 2022-05-01 DIAGNOSIS — I255 Ischemic cardiomyopathy: Secondary | ICD-10-CM

## 2022-05-01 DIAGNOSIS — E785 Hyperlipidemia, unspecified: Secondary | ICD-10-CM

## 2022-05-01 DIAGNOSIS — I2510 Atherosclerotic heart disease of native coronary artery without angina pectoris: Secondary | ICD-10-CM | POA: Diagnosis not present

## 2022-05-01 DIAGNOSIS — Z87891 Personal history of nicotine dependence: Secondary | ICD-10-CM

## 2022-05-01 NOTE — Patient Instructions (Signed)
Medication Instructions:  Your physician recommends that you continue on your current medications as directed. Please refer to the Current Medication list given to you today.  *If you need a refill on your cardiac medications before your next appointment, please call your pharmacy*  Lab Work: NONE ordered at this time of appointment   If you have labs (blood work) drawn today and your tests are completely normal, you will receive your results only by: MyChart Message (if you have MyChart) OR A paper copy in the mail If you have any lab test that is abnormal or we need to change your treatment, we will call you to review the results.  Testing/Procedures: NONE ordered at this time of appointment   Follow-Up: At [facility], you and your health needs are our priority.  As part of our continuing mission to provide you with exceptional heart care, we have created designated Provider Care Teams.  These Care Teams include your primary Cardiologist (physician) and Advanced Practice Providers (APPs -  Physician Assistants and Nurse Practitioners) who all work together to provide you with the care you need, when you need it.  We recommend signing up for the patient portal called "MyChart".  Sign up information is provided on this After Visit Summary.  MyChart is used to connect with patients for Virtual Visits (Telemedicine).  Patients are able to view lab/test results, encounter notes, upcoming appointments, etc.  Non-urgent messages can be sent to your provider as well.   To learn more about what you can do with MyChart, go to [email or web address].    Your next appointment:   6 month(s)  The format for your next appointment:   In Person  Provider:   Peter Jordan, MD     Other Instructions   Important Information About Sugar       

## 2022-05-01 NOTE — Progress Notes (Signed)
Office Visit    Patient Name: Jeffrey Green Date of Encounter: 05/01/2022  Primary Care Provider:  Pcp, No Primary Cardiologist:  Peter Sw[place]il[place], MD  Chief Complaint    66 year old male with a history of CAD s/p STEMI, DES-p-mLAD in 11/2021, ICM, hyperlipidemia, tobacco use, and cholecystitis s/p cholecystectomy in April 2023 who presents for follow-up related to CAD.   Past Medical History    Past Medical History:  Diagnosis Date   Medical history non-contributory    Past Surgical History:  Procedure Laterality Date   CHOLECYSTECTOMY N/A 08/24/2021   Procedure: LAPAROSCOPIC CHOLECYSTECTOMY;  Surgeon: Fritzi Mandes, MD;  Location: [facility] OR;  Service: General;  Laterality: N/A;   CORONARY/GRAFT ACUTE MI REVASCULARIZATION N/A 12/04/2021   Procedure: Coronary/Graft Acute MI Revascularization;  Surgeon: Swaziland, Peter M, MD;  Location: [facility] INVASIVE CV LAB;  Service: Cardiovascular;  Laterality: N/A;   LEFT HEART CATH AND CORONARY ANGIOGRAPHY N/A 12/04/2021   Procedure: LEFT HEART CATH AND CORONARY ANGIOGRAPHY;  Surgeon: Swaziland, Peter M, MD;  Location: [facility] INVASIVE CV LAB;  Service: Cardiovascular;  Laterality: N/A;    Allergies  No Known Allergies  History of Present Illness    66 year old male with a history of CAD s/p STEMI, DES-p-mLAD in 11/2021, ICM, hyperlipidemia, tobacco use, and cholecystitis s/p cholecystectomy in April 2023.   He was hospitalized in July 2023 in the setting of STEMI. He underwent emergent cardiac catheterization which revealed 100% p-mLAD stenosis s/p DES (culprit lesion), 25% p-mRCA, 60% D1 stenoses.  He was started on aspirin, Brilinta, Lipitor, metoprolol, and losartan.  Echocardiogram showed EF 30 to 35%, moderately decreased LV function, RWMA with mid to apical anteroseptal and inferoseptal akinesis, akinesis of the apical inferior, lateral, and anterior walls, akinesis of the true apex consistent with LAD territory MI, G1 DD, mild RV systolic function, no  significant valvular abnormalities.  He was started on Farxiga.  Further escalation of GDMT was limited in the setting of hypotension. Follow-up echocardiogram was recommended in 3 months. [name] was later held due to ongoing hypotension.  He was seen in the office on 01/30/2022 and was stable from a cardiac standpoint.  BP was improved with discontinuation of Farxiga.  He denied symptoms concerning for angina.  Echocardiogram in 03/2022 showed EF 55 to 60%, normal LV function, and G1 DD, normal RV systolic function, no significant valvular abnormalities.  He presents today for follow-up accompanied by his wife.  Since his last visit he has done well from a cardiac standpoint.  BP has been well-controlled.  He denies any symptoms concerning for angina.  He denies any dyspnea, edema, PND, orthopnea, weight gain.  He is not smoking.  His energy levels have improved.  Overall, he reports feeling well.  Home Medications      Current Outpatient Medications  Medication Sig Dispense Refill   aspirin EC 81 MG tablet Take 1 tablet (81 mg total) by mouth daily. Swallow whole. 30 tablet 12   atorvastatin (LIPITOR) 80 MG tablet Take 1 tablet (80 mg total) by mouth daily. 90 tablet 1   losartan (COZAAR) 25 MG tablet Take 0.5 tablets (12.5 mg total) by mouth daily. 45 tablet 1   metoprolol succinate (TOPROL-XL) 25 MG 24 hr tablet TAKE 1/2 TABLET BY MOUTH EVERY DAY 45 tablet 3   nitroGLYCERIN (NITROSTAT) 0.4 MG SL tablet Place 1 tablet (0.4 mg total) under the tongue every 5 (five) minutes x 3 doses as needed for chest pain. 25 tablet 2   ticagrelor (  BRILINTA) 90 MG TABS tablet Take 1 tablet (90 mg total) by mouth 2 (two) times daily. 60 tablet 11   FARXIGA 10 MG TABS tablet Take 10 mg by mouth daily. (Patient not taking: Reported on 05/01/2022)     No current facility-administered medications for this visit.     Review of Systems    He denies chest pain, palpitations, dyspnea, pnd, orthopnea, n, v, dizziness,  syncope, edema, weight gain, or early satiety. All other systems reviewed and are otherwise negative except as noted above.   Physical Exam    VS:  BP 138/84   Pulse 89   Ht 5\' 10"  (1.778 m)   Wt 172 lb 6.4 oz (78.2 kg)   SpO2 99%   BMI 24.74 kg/m   GEN: Well nourished, well developed, in no acute distress. HEENT: normal. Neck: Supple, no JVD, carotid bruits, or masses. Cardiac: RRR, no murmurs, rubs, or gallops. No clubbing, cyanosis, edema.  Radials/DP/PT 2+ and equal bilaterally.  Respiratory:  Respirations regular and unlabored, clear to auscultation bilaterally. GI: Soft, nontender, nondistended, BS + x 4. MS: no deformity or atrophy. Skin: warm and dry, no rash. Neuro:  Strength and sensation are intact. Psych: Normal affect.  Accessory Clinical Findings    ECG personally reviewed by me today - No EKG in office, today.   Lab Results  Component Value Date   WBC 12.5 (H) 12/05/2021   HGB 15.9 12/05/2021   HCT 46.2 12/05/2021   MCV 88.3 12/05/2021   PLT 192 12/05/2021   Lab Results  Component Value Date   CREATININE 1.06 12/18/2021   BUN 13 12/18/2021   NA 144 12/18/2021   K 4.3 12/18/2021   CL 103 12/18/2021   CO2 23 12/18/2021   Lab Results  Component Value Date   ALT 28 01/30/2022   AST 18 01/30/2022   ALKPHOS 95 01/30/2022   BILITOT 1.1 01/30/2022   Lab Results  Component Value Date   CHOL 124 04/24/2022   HDL 53 04/24/2022   LDLCALC 50 04/24/2022   TRIG 114 04/24/2022   CHOLHDL 2.3 04/24/2022    Lab Results  Component Value Date   HGBA1C 5.5 12/05/2021    Assessment & Plan    1. CAD: S/p DES p-mLAD. Stable with no anginal symptoms. Continue aspirin, Brilinta, losartan, metoprolol, and Lipitor.    2. ICM: Echo post MI showed EF 30 to 35%, moderately decreased LV function, RWMA with mid to apical anteroseptal and inferoseptal akinesis, akinesis of the apical inferior, lateral, and anterior walls, akinesis of the true apex consistent with LAD  territory MI, G1 DD, mild RV systolic function, no significant valvular abnormalities.  EF now improved to 55 to 60%.  Euvolemic and well compensated on exam. Continue current medications as above.    3. Hyperlipidemia: LDL was 50 in 04/2022. Continue aspirin, Lipitor.   4. Former tobacco use: He has not smoked since his hospital discharge.  I congratulated him on this.    5. Disposition: Follow-up in 6 months with Dr. 05/2022.      [place], NP 05/01/2022, 10:56 AM

## 2022-05-08 ENCOUNTER — Other Ambulatory Visit: Payer: Self-pay | Admitting: Student

## 2022-05-21 ENCOUNTER — Other Ambulatory Visit: Payer: Self-pay | Admitting: Student

## 2022-08-20 ENCOUNTER — Encounter: Payer: Self-pay | Admitting: Cardiology

## 2022-11-05 NOTE — Progress Notes (Signed)
Cardiology Office Note:    Date:  11/07/2022   ID:  Jeffrey Green, DOB December 06, 1955, MRN [id number]  PCP:  Aviva Kluver   [facility] Providers Cardiologist:  Jamarian Jacinto [place], MD     Referring MD: No ref. provider found   Chief Complaint  Patient presents with   Coronary Artery Disease    History of Present Illness:    Jeffrey Green is a 67 y.o. male with a hx of CAD s/p STEMI, DES-p-mLAD in 11/2021, ICM, hyperlipidemia, tobacco use.   He was hospitalized in July 2023 in the setting of STEMI. He underwent emergent cardiac catheterization which revealed 100% p-mLAD stenosis s/p DES (culprit lesion), 25% p-mRCA, 60% D1 stenoses.  He was started on aspirin, Brilinta, Lipitor, metoprolol, and losartan.  Echocardiogram showed EF 30 to 35%, moderately decreased LV function, RWMA with mid to apical anteroseptal and inferoseptal akinesis, akinesis of the apical inferior, lateral, and anterior walls, akinesis of the true apex consistent with LAD territory MI, G1 DD, mild RV systolic function, no significant valvular abnormalities.  He was started on Farxiga.  Further escalation of GDMT was limited in the setting of hypotension. Follow-up echocardiogram was recommended in 3 months.   He was seen in the office on 12/18/2021 and was stable from a cardiac standpoint.  [name] was later held due to ongoing hypotension. Seen again in Sept. 2023 and doing well. Echo repeated and showed normalization of LV function with EF 55-60%.   On follow up today he is feeling very well. Denies any chest pain, palpitations, SOB. Is very active farming. Raises cattle, corn, tobacco and produce. Not smoking.  Past Medical History:  Diagnosis Date   Medical history non-contributory     Past Surgical History:  Procedure Laterality Date   CHOLECYSTECTOMY N/A 08/24/2021   Procedure: LAPAROSCOPIC CHOLECYSTECTOMY;  Surgeon: Fritzi Mandes, MD;  Location: [facility] OR;  Service: General;  Laterality: N/A;   CORONARY/GRAFT ACUTE MI  REVASCULARIZATION N/A 12/04/2021   Procedure: Coronary/Graft Acute MI Revascularization;  Surgeon: [place]il[place], Nhi Butrum M, MD;  Location: [facility] INVASIVE CV LAB;  Service: Cardiovascular;  Laterality: N/A;   LEFT HEART CATH AND CORONARY ANGIOGRAPHY N/A 12/04/2021   Procedure: LEFT HEART CATH AND CORONARY ANGIOGRAPHY;  Surgeon: [place], Verenice Westrich M, MD;  Location: [facility] INVASIVE CV LAB;  Service: Cardiovascular;  Laterality: N/A;    Current Medications: Current Meds  Medication Sig   aspirin EC 81 MG tablet Take 1 tablet (81 mg total) by mouth daily. Swallow whole.   atorvastatin (LIPITOR) 80 MG tablet Take 1 tablet (80 mg total) by mouth daily.   metoprolol succinate (TOPROL-XL) 25 MG 24 hr tablet TAKE 1/2 TABLET BY MOUTH EVERY DAY   nitroGLYCERIN (NITROSTAT) 0.4 MG SL tablet Place 1 tablet (0.4 mg total) under the tongue every 5 (five) minutes x 3 doses as needed for chest pain.   ticagrelor (BRILINTA) 90 MG TABS tablet Take 1 tablet (90 mg total) by mouth 2 (two) times daily.   [DISCONTINUED] losartan (COZAAR) 25 MG tablet TAKE 1/2 TABLET BY MOUTH DAILY     Allergies:   Patient has no known allergies.   Social History   Socioeconomic History   Marital status: Married    Spouse name: Not on file   Number of children: Not on file   Years of education: Not on file   Highest education level: Not on file  Occupational History   Not on file  Tobacco Use   Smoking status: Former  Packs/day: 1    Types: Cigarettes    Quit date: 12/04/2021    Years since quitting: 0.9   Smokeless tobacco: Not on file  Vaping Use   Vaping Use: Never used  Substance and Sexual Activity   Alcohol use: Yes   Drug use: Not Currently   Sexual activity: Yes  Other Topics Concern   Not on file  Social History Narrative   Not on file   Social Determinants of Health   Financial Resource Strain: Not on file  Food Insecurity: Not on file  Transportation Needs: Not on file  Physical Activity: Not on file  Stress: Not on  file  Social Connections: Not on file     Family History: The patient's family history includes Heart disease in his father.  ROS:   Please see the history of present illness.     All other systems reviewed and are negative.  EKGs/Labs/Other Studies Reviewed:    The following studies were reviewed today: Cardiac cath 12/04/21:  Coronary/Graft Acute MI Revascularization  LEFT HEART CATH AND CORONARY ANGIOGRAPHY   Conclusion      Prox RCA to Mid RCA lesion is 25% stenosed.   Prox LAD to Mid LAD lesion is 100% stenosed.   1st Diag lesion is 60% stenosed.   A drug-eluting stent was successfully placed using a SYNERGY XD [id number].   Post intervention, there is a 0% residual stenosis.   LV end diastolic pressure is mildly elevated.   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 Slow flow resolved with IC verapamil. Moderate disease in the first diagonal will be treated medically.    Continue DAPT for one year. Assess LV function with Echo.  Coronary Diagrams  Diagnostic Dominance: Right  Intervention     Echo 12/05/21: IMPRESSIONS     1. Left ventricular ejection fraction, by estimation, is 30 to 35%. The  left ventricle has moderately decreased function. The left ventricle  demonstrates regional wall motion abnormalities with mid to apical  anteroseptal and inferoseptal akinesis;  akinesis of the apical inferior, lateral, and anterior walls; and akinesis  of the true apex. This is suggestive of LAD-territory MI. No LV thrombus  noted. There is mild left ventricular hypertrophy. Left ventricular  diastolic parameters are consistent  with Grade I diastolic dysfunction (impaired relaxation).   2. Right ventricular systolic function is normal. The right ventricular  size is normal. Tricuspid regurgitation signal is inadequate for assessing  PA pressure.   3. The mitral valve is normal in  structure. Trivial mitral valve  regurgitation. No evidence of mitral stenosis.   4. The aortic valve was not well visualized. Aortic valve regurgitation  is not visualized. No aortic stenosis is present.   5. The inferior vena cava is normal in size with <50% respiratory  variability, suggesting right atrial pressure of 8 mmHg.   Echo 03/18/22: IMPRESSIONS     1. Left ventricular ejection fraction, by estimation, is 55 to 60%. The  left ventricle has normal function. The left ventricle demonstrates  regional wall motion abnormalities (see scoring diagram/findings for  description). Left ventricular diastolic  parameters are consistent with Grade I diastolic dysfunction (impaired  relaxation).   2. Right ventricular systolic function is normal. The right ventricular  size is normal. There is normal pulmonary artery systolic pressure.   3. Left atrial size was mildly dilated.   4. The mitral valve is  normal in structure. Trivial mitral valve  regurgitation. No evidence of mitral stenosis.   5. The aortic valve is normal in structure. Aortic valve regurgitation is  not visualized. No aortic stenosis is present.   6. The inferior vena cava is normal in size with greater than 50%  respiratory variability, suggesting right atrial pressure of 3 mmHg.   Comparison(s): A prior study was performed on 11/2021. The left ventricular  function has improved.       Recent Labs: 12/04/2021: B Natriuretic Peptide 51.4; Magnesium 1.8 12/05/2021: Hemoglobin 15.9; Platelets 192; TSH 4.176 12/18/2021: BUN 13; Creatinine, Ser 1.06; Potassium 4.3; Sodium 144 01/30/2022: ALT 28  Recent Lipid Panel    Component Value Date/Time   CHOL 124 04/24/2022 0816   TRIG 114 04/24/2022 0816   HDL 53 04/24/2022 0816   CHOLHDL 2.3 04/24/2022 0816   CHOLHDL 4.8 12/05/2021 0652   VLDL 10 12/05/2021 0652   LDLCALC 50 04/24/2022 0816     Risk Assessment/Calculations:      HYPERTENSION CONTROL Vitals:   11/07/22  0751 11/07/22 0800  BP: (!) 152/99 (!) 144/86    The patient's blood pressure is elevated above target today.  In order to address the patient's elevated BP: A current anti-hypertensive medication was adjusted today.            Physical Exam:    VS:  BP (!) 144/86 (BP Location: Right Arm, Patient Position: Sitting, Cuff Size: Normal)   Pulse 72   Ht 5\' 10"  (1.778 m)   Wt 175 lb 12.8 oz (79.7 kg)   SpO2 99%   BMI 25.22 kg/m     Wt Readings from Last 3 Encounters:  11/07/22 175 lb 12.8 oz (79.7 kg)  05/01/22 172 lb 6.4 oz (78.2 kg)  01/30/22 164 lb 12.8 oz (74.8 kg)     GEN:  Well nourished, well developed in no acute distress HEENT: Normal NECK: No JVD; No carotid bruits LYMPHATICS: No lymphadenopathy CARDIAC: RRR, no murmurs, rubs, gallops RESPIRATORY:  Clear to auscultation without rales, wheezing or rhonchi  ABDOMEN: Soft, non-tender, non-distended MUSCULOSKELETAL:  No edema; No deformity  SKIN: Warm and dry NEUROLOGIC:  Alert and oriented x 3 PSYCHIATRIC:  Normal affect   ASSESSMENT:    1. Coronary artery disease involving native coronary artery of native heart without angina pectoris   2. Hyperlipidemia LDL goal <70   3. Former tobacco use   4. Ischemic cardiomyopathy    PLAN:    In order of problems listed above:  1. CAD: S/p DES p-mLAD in July 2023. Stable with no anginal symptoms. Continue aspirin, Brilinta, losartan, metoprolol, and Lipitor.  may discontinue Brilinta in one month. Heart healthy diet.   2. ICM: Echo post MI showed EF 30 to 35%. EF now improved to 55 to 60%.  Euvolemic and well compensated on exam. Continue current medications as above.   3. Hyperlipidemia: LDL was 50 in 04/2022. Continue aspirin, Lipitor. Will repeat fasting lab in 6 months   4. Former tobacco use: He has not smoked since his hospital discharge.  I congratulated him on this.   5. HTN. BP not at goal <130/80. Will increase losartan to 25 mg daily. Asked him to monitor  BP at home and let us know if it remains high.            Medication Adjustments/Labs and Tests Ordered: Current medicines are reviewed at length with the patient today.  Concerns regarding medicines are outlined above.  No  orders of the defined types were placed in this encounter.  Meds ordered this encounter  Medications   losartan (COZAAR) 25 MG tablet    Sig: Take 1 tablet (25 mg total) by mouth daily.    Dispense:  90 tablet    Refill:  3    There are no Patient Instructions on file for this visit.   Signed, Jacynda Brunke [place], MD  11/07/2022 8:03 AM    [facility]

## 2022-11-06 ENCOUNTER — Other Ambulatory Visit: Payer: Self-pay | Admitting: Student

## 2022-11-07 ENCOUNTER — Ambulatory Visit: Payer: BC Managed Care – PPO | Attending: Cardiology | Admitting: Cardiology

## 2022-11-07 ENCOUNTER — Encounter: Payer: Self-pay | Admitting: Cardiology

## 2022-11-07 VITALS — BP 144/86 | HR 72 | Ht 70.0 in | Wt 175.8 lb

## 2022-11-07 DIAGNOSIS — E785 Hyperlipidemia, unspecified: Secondary | ICD-10-CM | POA: Diagnosis not present

## 2022-11-07 DIAGNOSIS — I255 Ischemic cardiomyopathy: Secondary | ICD-10-CM | POA: Diagnosis not present

## 2022-11-07 DIAGNOSIS — I2510 Atherosclerotic heart disease of native coronary artery without angina pectoris: Secondary | ICD-10-CM

## 2022-11-07 DIAGNOSIS — Z87891 Personal history of nicotine dependence: Secondary | ICD-10-CM | POA: Diagnosis not present

## 2022-11-07 MED ORDER — LOSARTAN POTASSIUM 25 MG PO TABS: 25.0000 mg | ORAL_TABLET | Freq: Every day | ORAL | 3 refills | Status: DC

## 2022-11-07 NOTE — Patient Instructions (Signed)
Medication Instructions:  Stop Brilinta 12/05/22 Increase Losartan to 25 mg daily Continue all other medications  *If you need a refill on your cardiac medications before your next appointment, please call your pharmacy*   Lab Work: Bmet,lipid and hepatic panels to be done 1 week before Jan appointment    Testing/Procedures: None ordered   Follow-Up: At [facility], you and your health needs are our priority.  As part of our continuing mission to provide you with exceptional heart care, we have created designated Provider Care Teams.  These Care Teams include your primary Cardiologist (physician) and Advanced Practice Providers (APPs -  Physician Assistants and Nurse Practitioners) who all work together to provide you with the care you need, when you need it.  We recommend signing up for the patient portal called "MyChart".  Sign up information is provided on this After Visit Summary.  MyChart is used to connect with patients for Virtual Visits (Telemedicine).  Patients are able to view lab/test results, encounter notes, upcoming appointments, etc.  Non-urgent messages can be sent to your provider as well.   To learn more about what you can do with MyChart, go to [email or web address].    Your next appointment:  6 months    Call in Sept to schedule Jan appointment     Provider:  Dr.Jordan     Check blood pressure daily call if 130/80 or greater

## 2022-11-17 ENCOUNTER — Other Ambulatory Visit: Payer: Self-pay | Admitting: Student

## 2023-04-22 ENCOUNTER — Telehealth: Payer: Self-pay | Admitting: Cardiology

## 2023-04-22 NOTE — Telephone Encounter (Signed)
Patient identification verified by 2 forms. Marilynn Rail, RN    Called and spoke to patient  Patient states:   -BP has been high today   -this morning he was a bit dizzy and checked BP: 165/70 (prior to taking medications)   -Recent BP 1 hour ago: 163/85  -BP medication: Metoprolol 12.5mg  daily, losartan 25mg  daily   -takes BP medications every morning  -dizziness has resolved  -felt fine yesterday, did not check BP   -typically checks BP weekly, last week BP was 113/69 Patient denies:   -headaches   -visual changes/disturbances   -chest pain   -SOB/difficulty breathing  Informed patient:   -check BP daily for the next few days in the morning and an hour after taking BP medications   -document BP logs   -continue taking medications as directed   -message sent to Dr. [place]  RN reviewed strict ED warning signs/precautions  Patient verbalized understanding, no questions at this time

## 2023-04-22 NOTE — Telephone Encounter (Signed)
 Patient identification verified by 2 forms. Marilynn Rail, RN   Called and spoke to patient  Relayed provider message below  Patient verbalized understanding, no questions at this time

## 2023-04-22 NOTE — Telephone Encounter (Signed)
Pt c/o BP issue: STAT if pt c/o blurred vision, one-sided weakness or slurred speech  1. What are your last 5 BP readings?   Today's readings 163/85 - patient stated just now 143/70 - lunch time 165/70 - about 7:00 am this morning  2. Are you having any other symptoms (ex. Dizziness, headache, blurred vision, passed out)?   Dizziness this morning  3. What is your BP issue?   Patient stated he was dizzy this morning but has since subsided.

## 2023-04-22 NOTE — Telephone Encounter (Signed)
[place], Peter M, MD  You19 minutes ago (4:16 PM)   I hare to make changes based on 1 reading. Watch salt intake. Let's see what the trend is over time and let us know if it is persistently high.  Peter [place] MD, [facility]

## 2023-05-02 ENCOUNTER — Other Ambulatory Visit: Payer: Self-pay | Admitting: Nurse Practitioner

## 2023-05-19 LAB — BASIC METABOLIC PANEL
BUN/Creatinine Ratio: 8 — ABNORMAL LOW (ref 10–24)
BUN: 9 mg/dL (ref 8–27)
CO2: 23 mmol/L (ref 20–29)
Calcium: 9.7 mg/dL (ref 8.6–10.2)
Chloride: 100 mmol/L (ref 96–106)
Creatinine, Ser: 1.11 mg/dL (ref 0.76–1.27)
Glucose: 107 mg/dL — ABNORMAL HIGH (ref 70–99)
Potassium: 4.6 mmol/L (ref 3.5–5.2)
Sodium: 141 mmol/L (ref 134–144)
eGFR: 74 mL/min/{1.73_m2} (ref >59)

## 2023-05-19 LAB — HEPATIC FUNCTION PANEL
ALT: 54 [IU]/L — ABNORMAL HIGH (ref 0–44)
AST: 36 [IU]/L (ref 0–40)
Albumin: 5 g/dL — ABNORMAL HIGH (ref 3.9–4.9)
Alkaline Phosphatase: 100 [IU]/L (ref 44–121)
Bilirubin Total: 0.8 mg/dL (ref 0.0–1.2)
Bilirubin, Direct: 0.24 mg/dL (ref 0.00–0.40)
Total Protein: 7.5 g/dL (ref 6.0–8.5)

## 2023-05-19 LAB — LIPID PANEL
Chol/HDL Ratio: 3 {ratio} (ref 0.0–5.0)
Cholesterol, Total: 136 mg/dL (ref 100–199)
HDL: 46 mg/dL (ref >39)
LDL Chol Calc (NIH): 67 mg/dL (ref 0–99)
Triglycerides: 133 mg/dL (ref 0–149)
VLDL Cholesterol Cal: 23 mg/dL (ref 5–40)

## 2023-05-20 NOTE — Progress Notes (Signed)
 Cardiology Office Note:    Date:  05/26/2023   ID:  Jeffrey Green, DOB 01/04/1956, MRN [id number]  PCP:  Freddrick Johns   [facility] Providers Cardiologist:  Vaudine Dutan, MD     Referring MD: No ref. provider found   Chief Complaint  Patient presents with   Coronary Artery Disease    History of Present Illness:    Jeffrey Green is a 68 y.o. male with a hx of CAD s/p STEMI, DES-p-mLAD in 11/2021, ICM, hyperlipidemia, tobacco use.   He was hospitalized in July 2023 in the setting of STEMI. He underwent emergent cardiac catheterization which revealed 100% p-mLAD stenosis s/p DES (culprit lesion), 25% p-mRCA, 60% D1 stenoses.  He was started on aspirin , Brilinta , Lipitor , metoprolol , and losartan .  Echocardiogram showed EF 30 to 35%, moderately decreased LV function, RWMA with mid to apical anteroseptal and inferoseptal akinesis, akinesis of the apical inferior, lateral, and anterior walls, akinesis of the true apex consistent with LAD territory MI, G1 DD, mild RV systolic function, no significant valvular abnormalities.  He was started on Farxiga .  Further escalation of GDMT was limited in the setting of hypotension. Follow-up echocardiogram was recommended in 3 months.   He was seen in the office on 12/18/2021 and was stable from a cardiac standpoint.  Farxiga  was later held due to ongoing hypotension. Seen again in Sept. 2023 and doing well. Echo repeated and showed normalization of LV function with EF 55-60%.   On follow up today he is feeling very well. Denies any chest pain, palpitations, SOB. Noted elevated BP with dizziness in mid Dec but reports BP came down and typically is 130/70.  Is very active farming. Raises cattle, corn, tobacco and produce. Not smoking.  Past Medical History:  Diagnosis Date   Medical history non-contributory     Past Surgical History:  Procedure Laterality Date   CHOLECYSTECTOMY N/A 08/24/2021   Procedure: LAPAROSCOPIC CHOLECYSTECTOMY;  Surgeon: Dasie Leonor CROME, MD;  Location: [facility] OR;  Service: General;  Laterality: N/A;   CORONARY/GRAFT ACUTE MI REVASCULARIZATION N/A 12/04/2021   Procedure: Coronary/Graft Acute MI Revascularization;  Surgeon: Joanie Duprey M, MD;  Location: [facility] INVASIVE CV LAB;  Service: Cardiovascular;  Laterality: N/A;   LEFT HEART CATH AND CORONARY ANGIOGRAPHY N/A 12/04/2021   Procedure: LEFT HEART CATH AND CORONARY ANGIOGRAPHY;  Surgeon: Ransome Helwig M, MD;  Location: [facility] INVASIVE CV LAB;  Service: Cardiovascular;  Laterality: N/A;    Current Medications: Current Meds  Medication Sig   aspirin  EC 81 MG tablet Take 1 tablet (81 mg total) by mouth daily. Swallow whole.   atorvastatin  (LIPITOR ) 80 MG tablet TAKE 1 TABLET BY MOUTH EVERY DAY   losartan  (COZAAR ) 50 MG tablet Take 1 tablet (50 mg total) by mouth daily.   nitroGLYCERIN  (NITROSTAT ) 0.4 MG SL tablet Place 1 tablet (0.4 mg total) under the tongue every 5 (five) minutes x 3 doses as needed for chest pain.   [DISCONTINUED] losartan  (COZAAR ) 25 MG tablet Take 1 tablet (25 mg total) by mouth daily.   [DISCONTINUED] metoprolol  succinate (TOPROL -XL) 25 MG 24 hr tablet TAKE 1/2 TABLET BY MOUTH DAILY     Allergies:   Patient has no known allergies.   Social History   Socioeconomic History   Marital status: Married    Spouse name: Not on file   Number of children: Not on file   Years of education: Not on file   Highest education level: Not on file  Occupational History  Not on file  Tobacco Use   Smoking status: Former    Current packs/day: 0.00    Types: Cigarettes    Quit date: 12/04/2021    Years since quitting: 1.4   Smokeless tobacco: Not on file  Vaping Use   Vaping status: Never Used  Substance and Sexual Activity   Alcohol use: Yes   Drug use: Not Currently   Sexual activity: Yes  Other Topics Concern   Not on file  Social History Narrative   Not on file   Social Drivers of Health   Financial Resource Strain: Not on file  Food Insecurity:  Not on file  Transportation Needs: Not on file  Physical Activity: Not on file  Stress: Not on file  Social Connections: Not on file     Family History: The patient's family history includes Heart disease in his father.  ROS:   Please see the history of present illness.     All other systems reviewed and are negative.  EKGs/Labs/Other Studies Reviewed:    The following studies were reviewed today: Cardiac cath 12/04/21:  Coronary/Graft Acute MI Revascularization  LEFT HEART CATH AND CORONARY ANGIOGRAPHY   Conclusion      Prox RCA to Mid RCA lesion is 25% stenosed.   Prox LAD to Mid LAD lesion is 100% stenosed.   1st Diag lesion is 60% stenosed.   A drug-eluting stent was successfully placed using a SYNERGY XD [id number].   Post intervention, there is a 0% residual stenosis.   LV end diastolic pressure is mildly elevated.   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 Slow flow resolved with IC verapamil . Moderate disease in the first diagonal will be treated medically.    Continue DAPT for one year. Assess LV function with Echo.  Coronary Diagrams  Diagnostic Dominance: Right  Intervention     Echo 12/05/21: IMPRESSIONS     1. Left ventricular ejection fraction, by estimation, is 30 to 35%. The  left ventricle has moderately decreased function. The left ventricle  demonstrates regional wall motion abnormalities with mid to apical  anteroseptal and inferoseptal akinesis;  akinesis of the apical inferior, lateral, and anterior walls; and akinesis  of the true apex. This is suggestive of LAD-territory MI. No LV thrombus  noted. There is mild left ventricular hypertrophy. Left ventricular  diastolic parameters are consistent  with Grade I diastolic dysfunction (impaired relaxation).   2. Right ventricular systolic function is normal. The right ventricular  size is normal. Tricuspid  regurgitation signal is inadequate for assessing  PA pressure.   3. The mitral valve is normal in structure. Trivial mitral valve  regurgitation. No evidence of mitral stenosis.   4. The aortic valve was not well visualized. Aortic valve regurgitation  is not visualized. No aortic stenosis is present.   5. The inferior vena cava is normal in size with <50% respiratory  variability, suggesting right atrial pressure of 8 mmHg.   Echo 03/18/22: IMPRESSIONS     1. Left ventricular ejection fraction, by estimation, is 55 to 60%. The  left ventricle has normal function. The left ventricle demonstrates  regional wall motion abnormalities (see scoring diagram/findings for  description). Left ventricular diastolic  parameters are consistent with Grade I diastolic dysfunction (impaired  relaxation).   2. Right ventricular systolic function is normal. The right ventricular  size is normal. There is normal pulmonary artery systolic pressure.  3. Left atrial size was mildly dilated.   4. The mitral valve is normal in structure. Trivial mitral valve  regurgitation. No evidence of mitral stenosis.   5. The aortic valve is normal in structure. Aortic valve regurgitation is  not visualized. No aortic stenosis is present.   6. The inferior vena cava is normal in size with greater than 50%  respiratory variability, suggesting right atrial pressure of 3 mmHg.   Comparison(s): A prior study was performed on 11/2021. The left ventricular  function has improved.  EKG Interpretation Date/Time:  Tuesday May 26 2023 08:10:28 EST Ventricular Rate:  90 PR Interval:  132 QRS Duration:  84 QT Interval:  392 QTC Calculation: 479 R Axis:   36  Text Interpretation: Normal sinus rhythm Nonspecific ST abnormality When compared with ECG of 06-Dec-2021 07:10, Minimal criteria for Anteroseptal infarct are no longer Present ST no longer elevated in Anterior leads T wave inversion no longer evident in  Anterolateral leads Confirmed by Abraham Entwistle [phone]) on 05/26/2023 8:11:25 AM    Recent Labs: 05/19/2023: ALT 54; BUN 9; Creatinine, Ser 1.11; Potassium 4.6; Sodium 141  Recent Lipid Panel    Component Value Date/Time   CHOL 136 05/19/2023 0854   TRIG 133 05/19/2023 0854   HDL 46 05/19/2023 0854   CHOLHDL 3.0 05/19/2023 0854   CHOLHDL 4.8 12/05/2021 0652   VLDL 10 12/05/2021 0652   LDLCALC 67 05/19/2023 0854    EKG Interpretation Date/Time:  Tuesday May 26 2023 08:10:28 EST Ventricular Rate:  90 PR Interval:  132 QRS Duration:  84 QT Interval:  392 QTC Calculation: 479 R Axis:   36  Text Interpretation: Normal sinus rhythm Nonspecific ST abnormality When compared with ECG of 06-Dec-2021 07:10, Minimal criteria for Anteroseptal infarct are no longer Present ST no longer elevated in Anterior leads T wave inversion no longer evident in Anterolateral leads Confirmed by Keryn Nessler [phone]) on 05/26/2023 8:11:25 AM   Risk Assessment/Calculations:      HYPERTENSION CONTROL Vitals:   05/26/23 0806 05/26/23 0818  BP: (!) 168/74 (!) 168/94    The patient's blood pressure is elevated above target today.  In order to address the patient's elevated BP: A current anti-hypertensive medication was adjusted today.            Physical Exam:    VS:  BP (!) 168/94 (Cuff Size: Normal)   Pulse 90   Ht 5' 10 (1.778 m)   Wt 190 lb (86.2 kg)   SpO2 98%   BMI 27.26 kg/m     Wt Readings from Last 3 Encounters:  05/26/23 190 lb (86.2 kg)  11/07/22 175 lb 12.8 oz (79.7 kg)  05/01/22 172 lb 6.4 oz (78.2 kg)     GEN:  Well nourished, well developed in no acute distress HEENT: Normal NECK: No JVD; No carotid bruits LYMPHATICS: No lymphadenopathy CARDIAC: RRR, no murmurs, rubs, gallops RESPIRATORY:  Clear to auscultation without rales, wheezing or rhonchi  ABDOMEN: Soft, non-tender, non-distended MUSCULOSKELETAL:  No edema; No deformity  SKIN: Warm and dry NEUROLOGIC:  Alert  and oriented x 3 PSYCHIATRIC:  Normal affect   ASSESSMENT:    1. Coronary artery disease involving native coronary artery of native heart without angina pectoris   2. Hyperlipidemia LDL goal <70   3. Tobacco use     PLAN:    In order of problems listed above:  1. CAD: S/p DES p-mLAD in July 2023. Stable with no anginal symptoms. Continue aspirin , losartan ,  and Lipitor .  Heart healthy diet. Will discontinue low dose Toprol  XL  2. ICM: Echo post MI showed EF 30 to 35%. EF now improved to 55 to 60%.  Euvolemic and well compensated on exam. Continue current medications as above.   3. Hyperlipidemia: LDL was 50 in 04/2022. More recently up to 67. On high dose lipitor  80 mg daily. Admits he has gained weight over the holidays. Watch diet more closely.   4. Former tobacco use: He has not smoked since his hospital discharge.  I congratulated him on this.   5. HTN. BP not at goal <130/80. Will increase losartan  to 50  mg daily. Asked him to monitor BP at home and let us  know if it remains high.       Follow up in 6 months     Medication Adjustments/Labs and Tests Ordered: Current medicines are reviewed at length with the patient today.  Concerns regarding medicines are outlined above.  Orders Placed This Encounter  Procedures   EKG 12-Lead   Meds ordered this encounter  Medications   losartan  (COZAAR ) 50 MG tablet    Sig: Take 1 tablet (50 mg total) by mouth daily.    Dispense:  90 tablet    Refill:  3    There are no Patient Instructions on file for this visit.   Signed, Parth Mccormac, MD  05/26/2023 8:21 AM    [facility]

## 2023-05-26 ENCOUNTER — Ambulatory Visit: Payer: PPO | Attending: Cardiology | Admitting: Cardiology

## 2023-05-26 ENCOUNTER — Encounter: Payer: Self-pay | Admitting: Cardiology

## 2023-05-26 VITALS — BP 168/94 | HR 90 | Ht 70.0 in | Wt 190.0 lb

## 2023-05-26 DIAGNOSIS — E785 Hyperlipidemia, unspecified: Secondary | ICD-10-CM | POA: Diagnosis not present

## 2023-05-26 DIAGNOSIS — Z720 Tobacco use: Secondary | ICD-10-CM

## 2023-05-26 DIAGNOSIS — I2510 Atherosclerotic heart disease of native coronary artery without angina pectoris: Secondary | ICD-10-CM | POA: Diagnosis not present

## 2023-05-26 MED ORDER — LOSARTAN POTASSIUM 50 MG PO TABS: 50.0000 mg | ORAL_TABLET | Freq: Every day | ORAL | 3 refills | Status: DC

## 2023-05-26 NOTE — Patient Instructions (Addendum)
 Medication Instructions:  STOP TOPROL  XL 25 mg daily START Losartan  50 mg daily   *If you need a refill on your cardiac medications before your next appointment, please call your pharmacy*   Follow-Up: At [facility], you and your health needs are our priority.  As part of our continuing mission to provide you with exceptional heart care, we have created designated Provider Care Teams.  These Care Teams include your primary Cardiologist (physician) and Advanced Practice Providers (APPs -  Physician Assistants and Nurse Practitioners) who all work together to provide you with the care you need, when you need it.   Your next appointment:   6 month(s)  Provider:   Peter Jordan, MD

## 2023-10-04 IMAGING — CT CT ABD-PELV W/ CM
2 of 5 series · 15 of 46 positions shown, 17 images · IV contrast (APPLIED)
Comparison: CT Abdomen and Pelvis 08/19/2021.

CLINICAL DATA: 65-year-old male with recurrent abdominal pain. CT
Abdomen and Pelvis 5 days ago demonstrating cholelithiasis.

EXAM:
CT ABDOMEN AND PELVIS WITH CONTRAST
TECHNIQUE: Multidetector CT imaging of the abdomen and pelvis was performed
using the standard protocol following bolus administration of
intravenous contrast.

[Series 2: abd pel w · axial · 0.68mm/px · z∈[+694,+1124]mm · 12 of 96 slices shown, 14 images]
[im 5/96  soft-tissue]
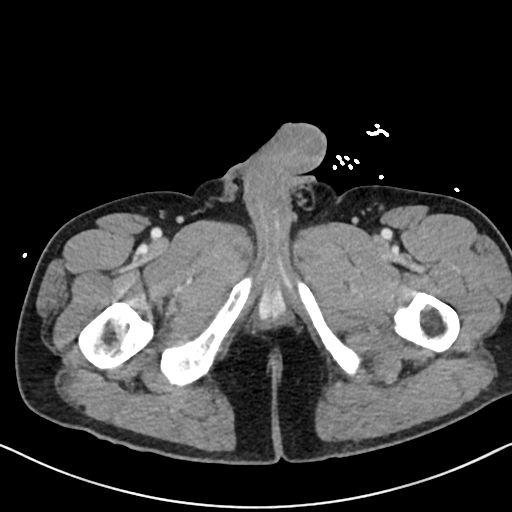
[im 5/96  bone]
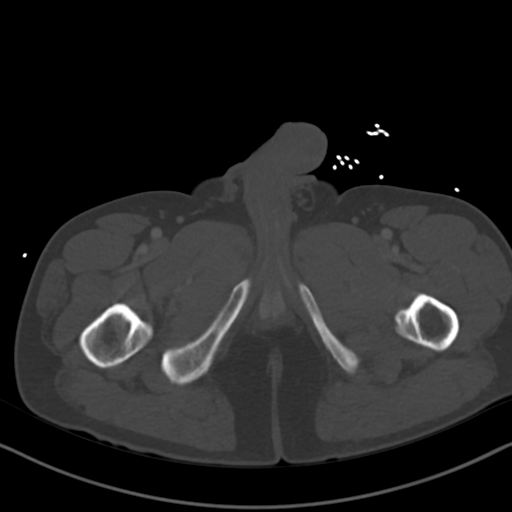
[im 14/96  soft-tissue]
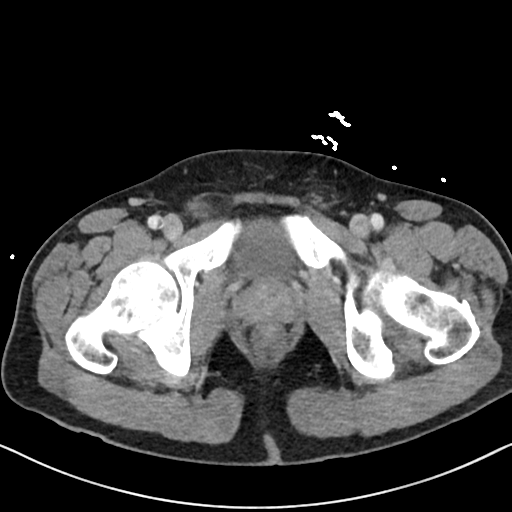
[im 23/96  soft-tissue]
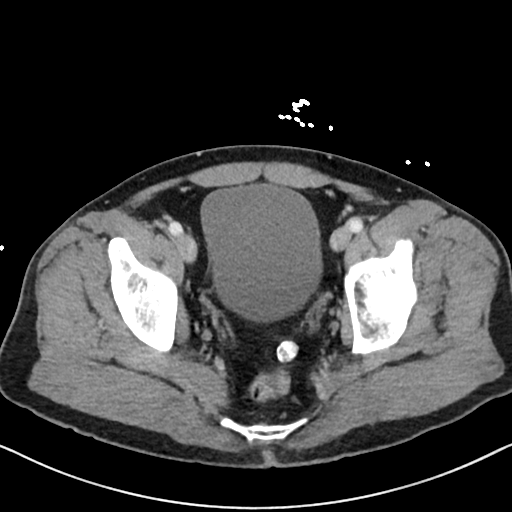
[im 28/96  soft-tissue]
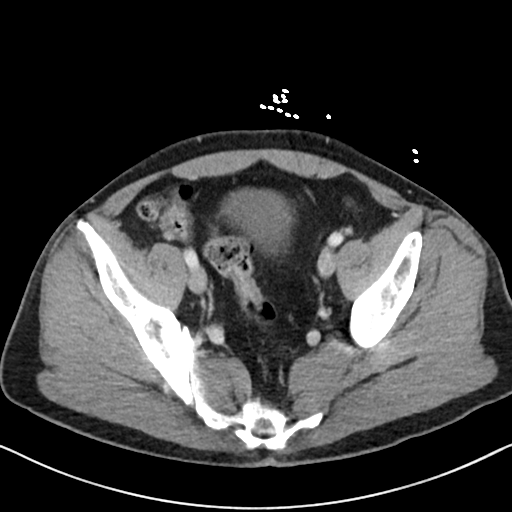
[im 37/96  soft-tissue]
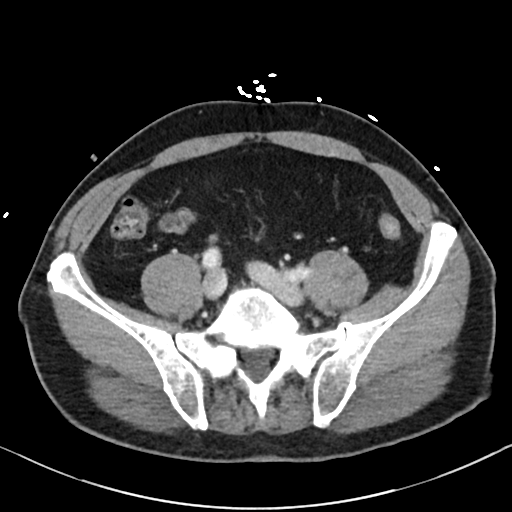
[im 46/96  soft-tissue]
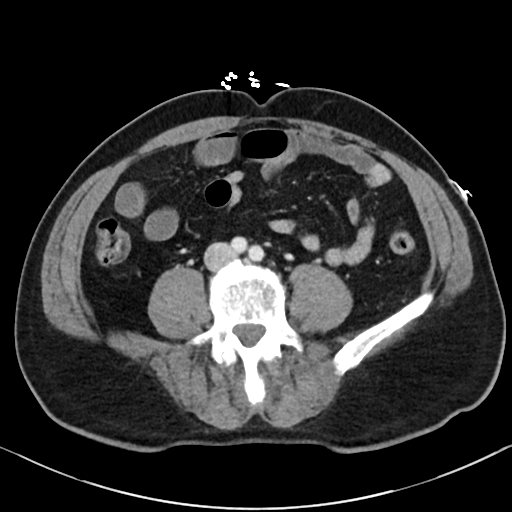
[im 50/96  soft-tissue]
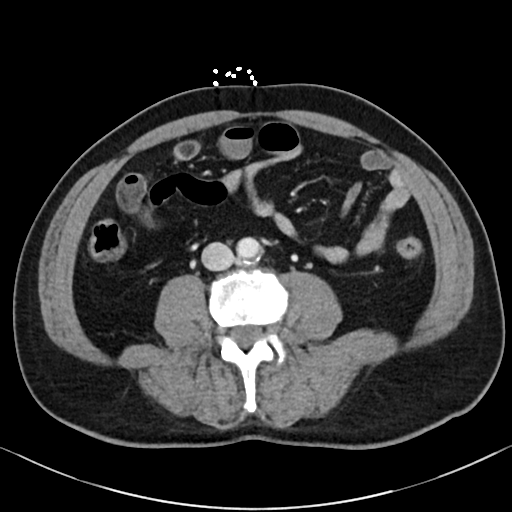
[im 59/96  soft-tissue]
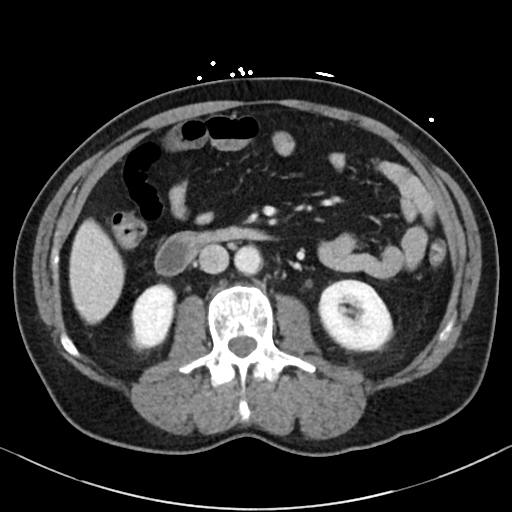
[im 68/96  soft-tissue]
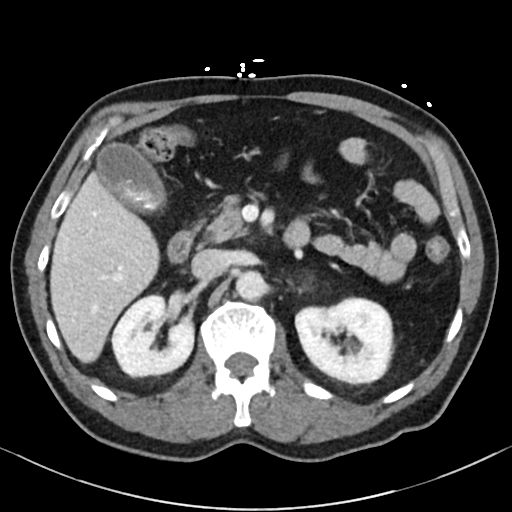
[im 68/96  bone]
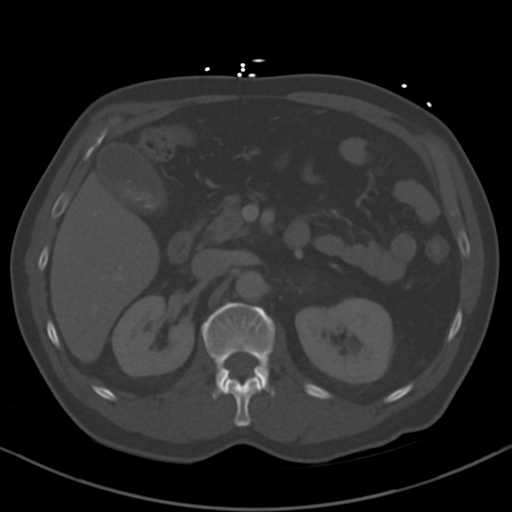
[im 73/96  soft-tissue]
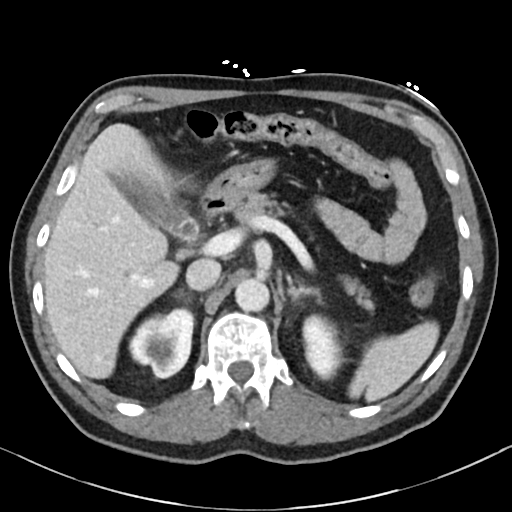
[im 82/96  soft-tissue]
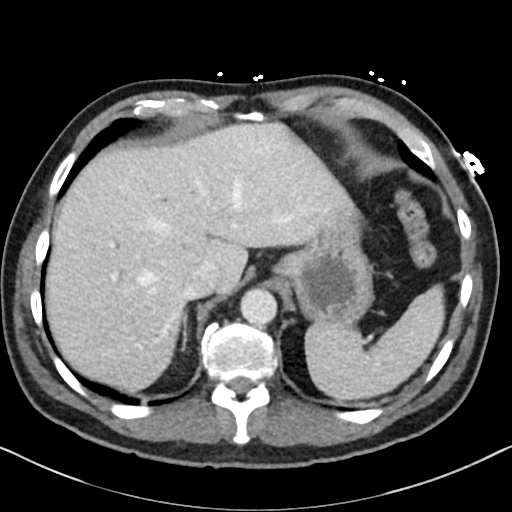
[im 91/96  soft-tissue]
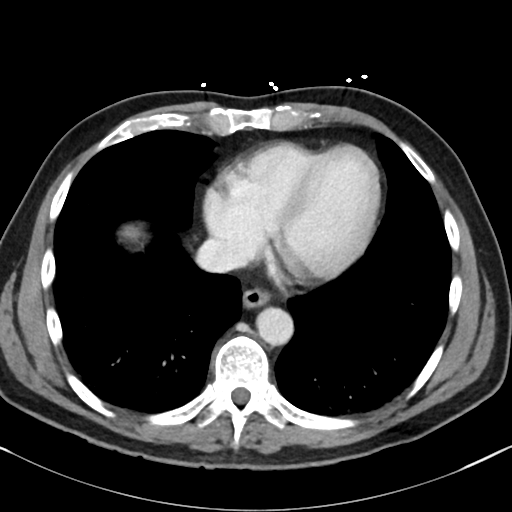

[Series 5: coronal · coronal · 0.69mm/px · 3 of 92 slices shown]
[im 31/92  soft-tissue]
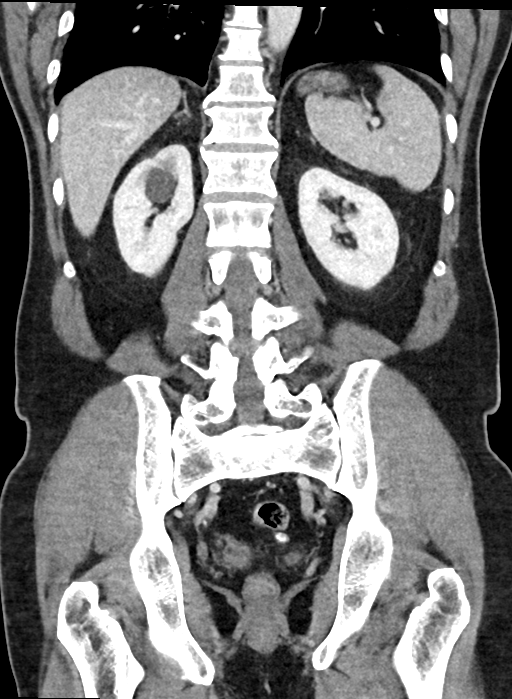
[im 41/92  soft-tissue]
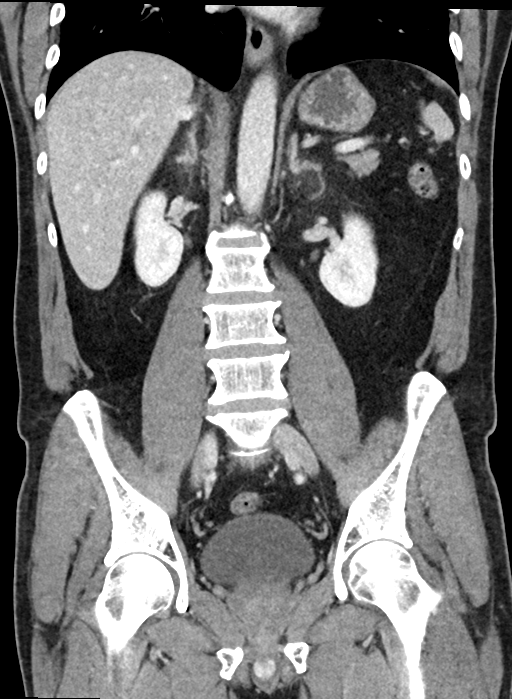
[im 51/92  soft-tissue]
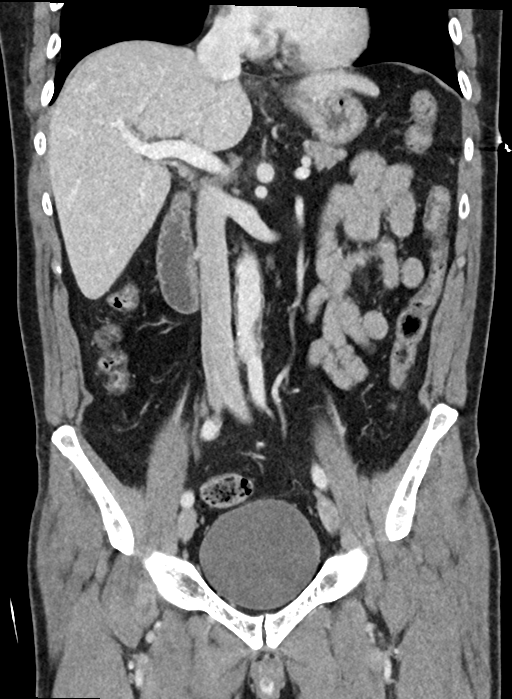

[15 of 46 positions shown; findings below may reference images not displayed]

RADIATION DOSE REDUCTION: This exam was performed according to the
departmental dose-optimization program which includes automated
exposure control, adjustment of the mA and/or kV according to
patient size and/or use of iterative reconstruction technique.

CONTRAST:  100mL OMNIPAQUE IOHEXOL 300 MG/ML  SOLN
FINDINGS: Lower chest: Increased dependent atelectasis. No pericardial or
pleural effusion.

Hepatobiliary: Round 16 mm gallstone appears lodged in the neck of
the gallbladder (coronal image 56 and series 2, image 23) with
additional cholelithiasis and indistinct appearance of the
gallbladder wall with evidence of pericholecystic inflammation or
fluid in the gallbladder fossa (series 2, image 27 and coronal image
65). Common hepatic duct upstream of the gallbladder is at the upper
limits of normal but there is mild if any intrahepatic biliary
ductal dilatation. Liver parenchyma remains within normal limits.
CBD is nondilated.

Pancreas: Negative.

Spleen: Negative.

Adrenals/Urinary Tract: Stable and negative; right upper pole benign
appearing renal cyst (no follow-up imaging recommended).

Stomach/Bowel: Large bowel and appendix appear stable and negative
(appendix on coronal image 53). No dilated small bowel. Decompressed
stomach and duodenum. No free air, free fluid, convincing mesenteric
inflammation.

Vascular/Lymphatic: Calcified aortic atherosclerosis. Major arterial
structures remain patent. Portal venous system is patent. No
lymphadenopathy.

Reproductive: Negative.

Other: No pelvic free fluid.

Musculoskeletal: Stable visualized osseous structures. Disc
degeneration and vacuum disc at the lumbosacral junction.
IMPRESSION: 1. Evidence of Acute Cholecystitis and a 16 mm gallstone lodged in
the neck of the gallbladder. Up to mild associated intrahepatic
biliary ductal dilatation.

2. No other acute or inflammatory process identified in the abdomen
or pelvis. Aortic Atherosclerosis ([id number]).

## 2023-10-04 IMAGING — DX DG ABDOMEN ACUTE W/ 1V CHEST
4 series · 4 of 4 positions shown · non-contrast
Comparison: CT Abdomen and Pelvis 08/19/2021.

CLINICAL DATA: 65-year-old male with recurrent abdominal pain and
nausea.

EXAM:
DG ABDOMEN ACUTE WITH 1 VIEW CHEST

[chest pa]
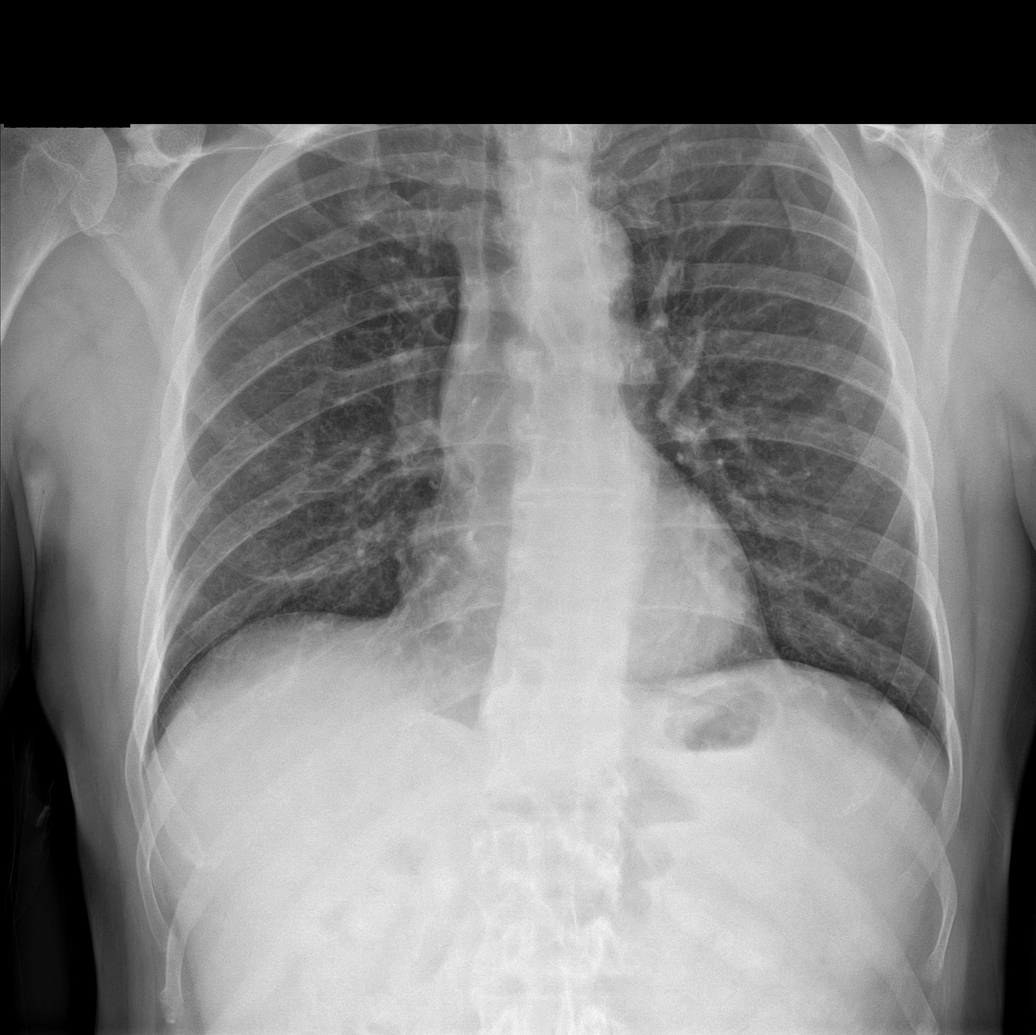

[abdomen erect]
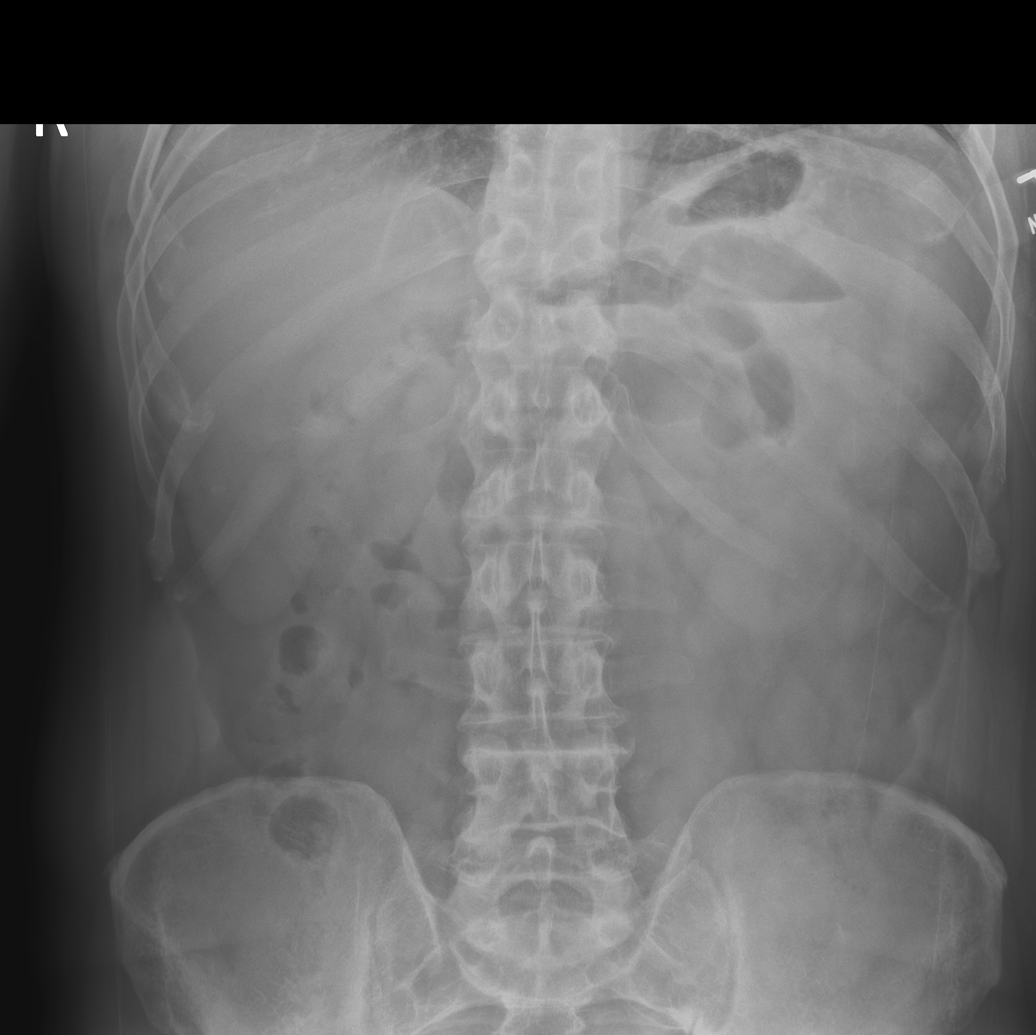

[abdomen kub (1 of 2)]
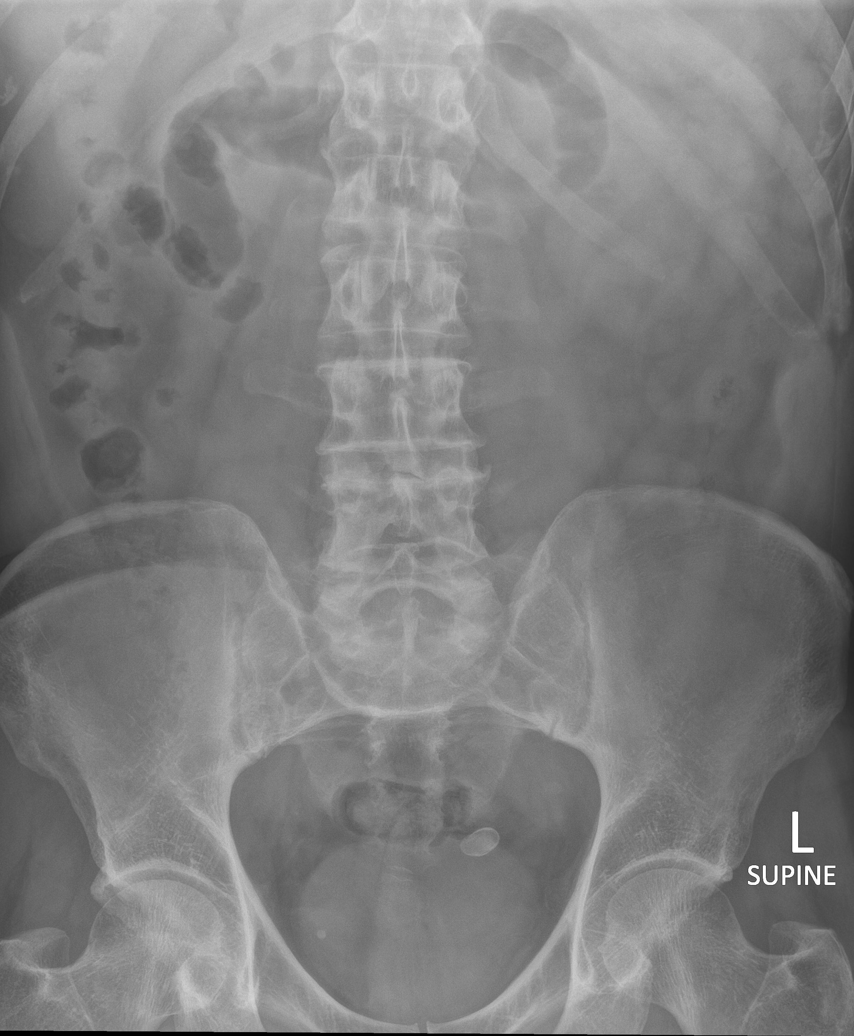

[abdomen kub (2 of 2)]
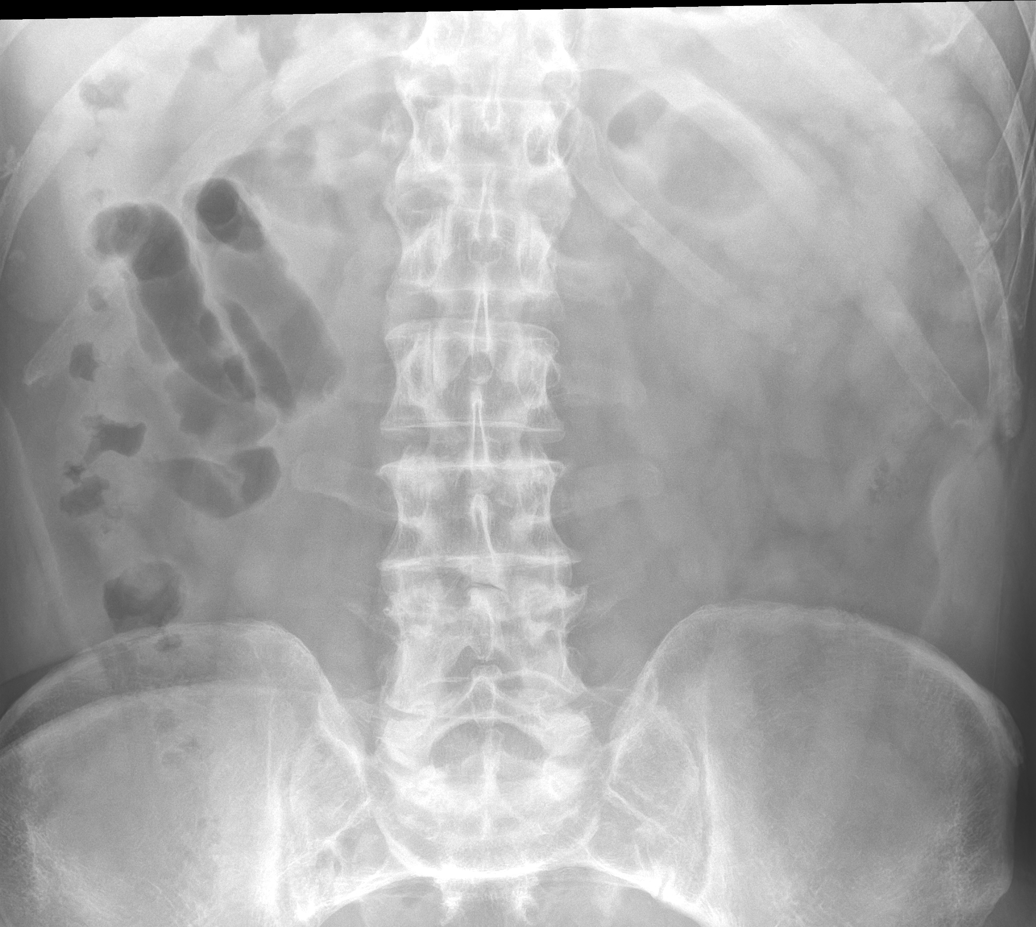

[4 of 4 positions shown; findings below may reference images not displayed]

FINDINGS: PA upright view of the chest at 6003 hours. Lung volumes and
mediastinal contours are normal. Visualized tracheal air column is
within normal limits. Lung markings are within normal limits. No
pneumothorax, pneumoperitoneum or pleural effusion.

Non obstructed bowel gas pattern. No pneumoperitoneum on upright
views of the abdomen. Abdominal and pelvic visceral contours appear
stable. Cholelithiasis demonstrated by CT is not readily apparent.
Pelvic calcified lymph node or phlebolith is incidental. No acute
osseous abnormality identified.
IMPRESSION: 1. Normal bowel gas pattern, no free air. No acute cardiopulmonary
abnormality.

2. Prominent cholelithiasis demonstrated recently by CT is
radiographically occult. If there is right upper quadrant abdominal
pain then an Ultrasound may be most valuable.

## 2023-11-19 ENCOUNTER — Other Ambulatory Visit: Payer: Self-pay | Admitting: Cardiology

## 2023-12-04 ENCOUNTER — Encounter: Payer: Self-pay | Admitting: Cardiology

## 2023-12-04 ENCOUNTER — Ambulatory Visit: Attending: Cardiology | Admitting: Cardiology

## 2023-12-04 VITALS — BP 123/60 | HR 110 | Ht 70.0 in | Wt 178.0 lb

## 2023-12-04 DIAGNOSIS — I2510 Atherosclerotic heart disease of native coronary artery without angina pectoris: Secondary | ICD-10-CM | POA: Diagnosis not present

## 2023-12-04 DIAGNOSIS — E785 Hyperlipidemia, unspecified: Secondary | ICD-10-CM

## 2023-12-04 MED ORDER — NITROGLYCERIN 0.4 MG SL SUBL: 0.4000 mg | SUBLINGUAL_TABLET | SUBLINGUAL | 2 refills | Status: AC | PRN

## 2023-12-04 MED ORDER — ATORVASTATIN CALCIUM 80 MG PO TABS: 80.0000 mg | ORAL_TABLET | Freq: Every day | ORAL | 3 refills | Status: AC

## 2023-12-04 NOTE — Patient Instructions (Signed)

## 2023-12-04 NOTE — Progress Notes (Signed)
 Cardiology Office Note:    Date:  12/04/2023   ID:  Jeffrey Green, DOB 1956-05-04, MRN [id number]  PCP:  Freddrick Johns   [facility] Providers Cardiologist:  Geneveive Furness [place], MD     Referring MD: No ref. provider found   Chief Complaint  Patient presents with   Coronary Artery Disease    History of Present Illness:    Jeffrey Green is a 68 y.o. male with a hx of CAD s/p STEMI, DES-p-mLAD in 11/2021, ICM, hyperlipidemia, tobacco use.   He was hospitalized in July 2023 in the setting of STEMI. He underwent emergent cardiac catheterization which revealed 100% p-mLAD stenosis s/p DES (culprit lesion), 25% p-mRCA, 60% D1 stenoses.  He was started on aspirin , Brilinta , Lipitor , metoprolol , and losartan .  Echocardiogram showed EF 30 to 35%, moderately decreased LV function, RWMA with mid to apical anteroseptal and inferoseptal akinesis, akinesis of the apical inferior, lateral, and anterior walls, akinesis of the true apex consistent with LAD territory MI, G1 DD, mild RV systolic function, no significant valvular abnormalities.  He was started on Farxiga .  Further escalation of GDMT was limited in the setting of hypotension. Follow-up echocardiogram was recommended in 3 months.   He was seen in the office on 12/18/2021 and was stable from a cardiac standpoint.  Farxiga  was later held due to ongoing hypotension. Seen again in Sept. 2023 and doing well. Echo repeated and showed normalization of LV function with EF 55-60%.   On follow up today he is feeling very well. Denies any chest pain, palpitations, SOB. BP is well controlled.  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   Past Medical History:  Diagnosis Date   Medical history non-contributory     Past Surgical History:  Procedure Laterality Date   CHOLECYSTECTOMY N/A 08/24/2021   Procedure: LAPAROSCOPIC CHOLECYSTECTOMY;  Surgeon: Dasie Leonor CROME, MD;  Location: [facility] OR;  Service: General;  Laterality: N/A;   CORONARY/GRAFT ACUTE MI  REVASCULARIZATION N/A 12/04/2021   Procedure: Coronary/Graft Acute MI Revascularization;  Surgeon: [place], Takeisha Cianci M, MD;  Location: [facility] INVASIVE CV LAB;  Service: Cardiovascular;  Laterality: N/A;   LEFT HEART CATH AND CORONARY ANGIOGRAPHY N/A 12/04/2021   Procedure: LEFT HEART CATH AND CORONARY ANGIOGRAPHY;  Surgeon: [place], Clance Baquero M, MD;  Location: [facility] INVASIVE CV LAB;  Service: Cardiovascular;  Laterality: N/A;    Current Medications: Current Meds  Medication Sig   aspirin  EC 81 MG tablet Take 1 tablet (81 mg total) by mouth daily. Swallow whole.   losartan  (COZAAR ) 50 MG tablet Take 1 tablet (50 mg total) by mouth daily.   [DISCONTINUED] atorvastatin  (LIPITOR ) 80 MG tablet TAKE 1 TABLET BY MOUTH EVERY DAY   [DISCONTINUED] nitroGLYCERIN  (NITROSTAT ) 0.4 MG SL tablet Place 1 tablet (0.4 mg total) under the tongue every 5 (five) minutes x 3 doses as needed for chest pain.     Allergies:   Patient has no known allergies.   Social History   Socioeconomic History   Marital status: Married    Spouse name: Not on file   Number of children: Not on file   Years of education: Not on file   Highest education level: Not on file  Occupational History   Not on file  Tobacco Use   Smoking status: Former    Current packs/day: 0.00    Types: Cigarettes    Quit date: 12/04/2021    Years since quitting: 2.0   Smokeless tobacco: Not on file  Vaping Use  Vaping status: Never Used  Substance and Sexual Activity   Alcohol use: Yes   Drug use: Not Currently   Sexual activity: Yes  Other Topics Concern   Not on file  Social History Narrative   Not on file   Social Drivers of Health   Financial Resource Strain: Not on file  Food Insecurity: Not on file  Transportation Needs: Not on file  Physical Activity: Not on file  Stress: Not on file  Social Connections: Not on file     Family History: The patient's family history includes Heart disease in his father.  ROS:   Please see the history  of present illness.     All other systems reviewed and are negative.  EKGs/Labs/Other Studies Reviewed:    The following studies were reviewed today: Cardiac cath 12/04/21:  Coronary/Graft Acute MI Revascularization  LEFT HEART CATH AND CORONARY ANGIOGRAPHY   Conclusion      Prox RCA to Mid RCA lesion is 25% stenosed.   Prox LAD to Mid LAD lesion is 100% stenosed.   1st Diag lesion is 60% stenosed.   A drug-eluting stent was successfully placed using a SYNERGY XD [id number].   Post intervention, there is a 0% residual stenosis.   LV end diastolic pressure is mildly elevated.   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 Slow flow resolved with IC verapamil . Moderate disease in the first diagonal will be treated medically.    Continue DAPT for one year. Assess LV function with Echo.  Coronary Diagrams  Diagnostic Dominance: Right  Intervention     Echo 12/05/21: IMPRESSIONS     1. Left ventricular ejection fraction, by estimation, is 30 to 35%. The  left ventricle has moderately decreased function. The left ventricle  demonstrates regional wall motion abnormalities with mid to apical  anteroseptal and inferoseptal akinesis;  akinesis of the apical inferior, lateral, and anterior walls; and akinesis  of the true apex. This is suggestive of LAD-territory MI. No LV thrombus  noted. There is mild left ventricular hypertrophy. Left ventricular  diastolic parameters are consistent  with Grade I diastolic dysfunction (impaired relaxation).   2. Right ventricular systolic function is normal. The right ventricular  size is normal. Tricuspid regurgitation signal is inadequate for assessing  PA pressure.   3. The mitral valve is normal in structure. Trivial mitral valve  regurgitation. No evidence of mitral stenosis.   4. The aortic valve was not well visualized. Aortic valve regurgitation  is not  visualized. No aortic stenosis is present.   5. The inferior vena cava is normal in size with <50% respiratory  variability, suggesting right atrial pressure of 8 mmHg.   Echo 03/18/22: IMPRESSIONS     1. Left ventricular ejection fraction, by estimation, is 55 to 60%. The  left ventricle has normal function. The left ventricle demonstrates  regional wall motion abnormalities (see scoring diagram/findings for  description). Left ventricular diastolic  parameters are consistent with Grade I diastolic dysfunction (impaired  relaxation).   2. Right ventricular systolic function is normal. The right ventricular  size is normal. There is normal pulmonary artery systolic pressure.   3. Left atrial size was mildly dilated.   4. The mitral valve is normal in structure. Trivial mitral valve  regurgitation. No evidence of mitral stenosis.   5. The aortic valve is normal in structure. Aortic valve regurgitation is  not visualized. No aortic  stenosis is present.   6. The inferior vena cava is normal in size with greater than 50%  respiratory variability, suggesting right atrial pressure of 3 mmHg.   Comparison(s): A prior study was performed on 11/2021. The left ventricular  function has improved.       Recent Labs: 05/19/2023: ALT 54; BUN 9; Creatinine, Ser 1.11; Potassium 4.6; Sodium 141  Recent Lipid Panel    Component Value Date/Time   CHOL 136 05/19/2023 0854   TRIG 133 05/19/2023 0854   HDL 46 05/19/2023 0854   CHOLHDL 3.0 05/19/2023 0854   CHOLHDL 4.8 12/05/2021 0652   VLDL 10 12/05/2021 0652   LDLCALC 67 05/19/2023 0854        Risk Assessment/Calculations:                Physical Exam:    VS:  BP 123/60   Pulse (!) 110   Ht 5' 10 (1.778 m)   Wt 178 lb (80.7 kg)   SpO2 97%   BMI 25.54 kg/m     Wt Readings from Last 3 Encounters:  12/04/23 178 lb (80.7 kg)  05/26/23 190 lb (86.2 kg)  11/07/22 175 lb 12.8 oz (79.7 kg)     GEN:  Well nourished, well developed in  no acute distress HEENT: Normal NECK: No JVD; No carotid bruits LYMPHATICS: No lymphadenopathy CARDIAC: RRR, no murmurs, rubs, gallops RESPIRATORY:  Clear to auscultation without rales, wheezing or rhonchi  ABDOMEN: Soft, non-tender, non-distended MUSCULOSKELETAL:  No edema; No deformity  SKIN: Warm and dry NEUROLOGIC:  Alert and oriented x 3 PSYCHIATRIC:  Normal affect   ASSESSMENT:    1. Coronary artery disease involving native coronary artery of native heart without angina pectoris   2. Hyperlipidemia LDL goal <70      PLAN:    In order of problems listed above:  1. CAD: S/p DES p-mLAD in July 2023.  no anginal symptoms. Continue aspirin , losartan , and Lipitor .  Heart healthy diet.   2. ICM: Echo post MI showed EF 30 to 35%. EF now improved to 55 to 60%.  Euvolemic and well compensated on exam. Continue current medications as above.   3. Hyperlipidemia: LDL 67.  On high dose lipitor  80 mg daily.   4. Former tobacco use: He has not smoked in 2 years.  I congratulated him on this.   5. HTN. BP well controlled on losartan       Follow up in one year   Medication Adjustments/Labs and Tests Ordered: Current medicines are reviewed at length with the patient today.  Concerns regarding medicines are outlined above.  No orders of the defined types were placed in this encounter.  Meds ordered this encounter  Medications   nitroGLYCERIN  (NITROSTAT ) 0.4 MG SL tablet    Sig: Place 1 tablet (0.4 mg total) under the tongue every 5 (five) minutes x 3 doses as needed for chest pain.    Dispense:  25 tablet    Refill:  2   atorvastatin  (LIPITOR ) 80 MG tablet    Sig: Take 1 tablet (80 mg total) by mouth daily.    Dispense:  90 tablet    Refill:  3    There are no Patient Instructions on file for this visit.   Signed, Gemini Bunte [place], MD  12/04/2023 4:42 PM    [facility]

## 2024-04-24 ENCOUNTER — Other Ambulatory Visit: Payer: Self-pay | Admitting: Cardiology
